# Patient Record
Sex: Male | Born: 1975 | Race: Black or African American | Hispanic: No | Marital: Married | Smoking: Never smoker
Health system: Southern US, Community
[De-identification: ages and names within clinical notes are randomized; demographics above are authoritative.]

## PROBLEM LIST (undated history)

## (undated) ENCOUNTER — Emergency Department (HOSPITAL_COMMUNITY): Admission: EM | Payer: Self-pay | Source: Home / Self Care

## (undated) DIAGNOSIS — R000 Tachycardia, unspecified: Secondary | ICD-10-CM

## (undated) DIAGNOSIS — I4901 Ventricular fibrillation: Secondary | ICD-10-CM

## (undated) HISTORY — PX: OTHER SURGICAL HISTORY: SHX169

## (undated) HISTORY — PX: DOPPLER ECHOCARDIOGRAPHY: SHX263

## (undated) HISTORY — PX: CARDIAC CATHETERIZATION: SHX172

## (undated) HISTORY — PX: CARDIAC DEFIBRILLATOR PLACEMENT: SHX171

## (undated) HISTORY — DX: Ventricular fibrillation: I49.01

## (undated) HISTORY — DX: Tachycardia, unspecified: R00.0

---

## 2004-07-07 ENCOUNTER — Emergency Department (HOSPITAL_COMMUNITY): Admission: EM | Admit: 2004-07-07 | Discharge: 2004-07-07 | Payer: Self-pay | Admitting: Emergency Medicine

## 2007-09-06 ENCOUNTER — Emergency Department (HOSPITAL_COMMUNITY): Admission: EM | Admit: 2007-09-06 | Discharge: 2007-09-07 | Payer: Self-pay | Admitting: Emergency Medicine

## 2011-01-13 ENCOUNTER — Emergency Department (HOSPITAL_COMMUNITY): Payer: BC Managed Care – PPO

## 2011-01-13 ENCOUNTER — Inpatient Hospital Stay (HOSPITAL_COMMUNITY)
Admission: EM | Admit: 2011-01-13 | Discharge: 2011-01-18 | DRG: 549 | Disposition: A | Discharge status: Home / Self Care | Payer: BC Managed Care – PPO | Attending: Internal Medicine | Admitting: Internal Medicine

## 2011-01-13 DIAGNOSIS — I472 Ventricular tachycardia, unspecified: Secondary | ICD-10-CM

## 2011-01-13 DIAGNOSIS — I4901 Ventricular fibrillation: Principal | ICD-10-CM | POA: Diagnosis present

## 2011-01-13 DIAGNOSIS — R1115 Cyclical vomiting syndrome unrelated to migraine: Secondary | ICD-10-CM | POA: Diagnosis present

## 2011-01-13 DIAGNOSIS — I4729 Other ventricular tachycardia: Secondary | ICD-10-CM | POA: Diagnosis present

## 2011-01-13 DIAGNOSIS — R569 Unspecified convulsions: Secondary | ICD-10-CM | POA: Diagnosis present

## 2011-01-13 DIAGNOSIS — I469 Cardiac arrest, cause unspecified: Secondary | ICD-10-CM | POA: Diagnosis present

## 2011-01-13 DIAGNOSIS — J9600 Acute respiratory failure, unspecified whether with hypoxia or hypercapnia: Secondary | ICD-10-CM | POA: Diagnosis present

## 2011-01-13 LAB — CBC
MCH: 30.9 pg (ref 26.0–34.0)
MCHC: 34.4 g/dL (ref 30.0–36.0)
MCV: 89.9 fL (ref 78.0–100.0)
Platelets: 236 10*3/uL (ref 150–400)
RDW: 13 % (ref 11.5–15.5)
WBC: 11.6 10*3/uL — ABNORMAL HIGH (ref 4.0–10.5)

## 2011-01-13 LAB — BASIC METABOLIC PANEL
BUN: 12 mg/dL (ref 6–23)
Calcium: 9.1 mg/dL (ref 8.4–10.5)
Creatinine, Ser: 1.41 mg/dL — ABNORMAL HIGH (ref 0.50–1.35)
GFR calc Af Amer: >60 mL/min (ref >60)
GFR calc non Af Amer: 58 mL/min — ABNORMAL LOW (ref >60)

## 2011-01-13 LAB — CK TOTAL AND CKMB (NOT AT ARMC): Relative Index: 1.2 (ref 0.0–2.5)

## 2011-01-13 LAB — RAPID URINE DRUG SCREEN, HOSP PERFORMED
Amphetamines: NOT DETECTED
Benzodiazepines: POSITIVE — AB
Cocaine: NOT DETECTED
Opiates: NOT DETECTED

## 2011-01-13 LAB — GLUCOSE, CAPILLARY: Glucose-Capillary: 144 mg/dL — ABNORMAL HIGH (ref 70–99)

## 2011-01-13 LAB — POCT I-STAT 3, ART BLOOD GAS (G3+)
Acid-base deficit: 4 mmol/L — ABNORMAL HIGH (ref 0.0–2.0)
Bicarbonate: 23.3 mEq/L (ref 20.0–24.0)
pO2, Arterial: 458 mmHg — ABNORMAL HIGH (ref 80.0–100.0)

## 2011-01-13 LAB — MRSA PCR SCREENING: MRSA by PCR: NEGATIVE

## 2011-01-13 LAB — TROPONIN I: Troponin I: <0.3 ng/mL (ref <0.30)

## 2011-01-14 ENCOUNTER — Inpatient Hospital Stay (HOSPITAL_COMMUNITY): Payer: BC Managed Care – PPO

## 2011-01-14 DIAGNOSIS — Z9911 Dependence on respirator [ventilator] status: Secondary | ICD-10-CM

## 2011-01-14 DIAGNOSIS — I469 Cardiac arrest, cause unspecified: Secondary | ICD-10-CM

## 2011-01-14 DIAGNOSIS — I472 Ventricular tachycardia: Secondary | ICD-10-CM

## 2011-01-14 DIAGNOSIS — I4901 Ventricular fibrillation: Secondary | ICD-10-CM

## 2011-01-14 DIAGNOSIS — R55 Syncope and collapse: Secondary | ICD-10-CM

## 2011-01-14 DIAGNOSIS — I517 Cardiomegaly: Secondary | ICD-10-CM

## 2011-01-14 DIAGNOSIS — I999 Unspecified disorder of circulatory system: Secondary | ICD-10-CM

## 2011-01-14 DIAGNOSIS — J9600 Acute respiratory failure, unspecified whether with hypoxia or hypercapnia: Secondary | ICD-10-CM

## 2011-01-14 LAB — BLOOD GAS, ARTERIAL
Acid-base deficit: 0.6 mmol/L (ref 0.0–2.0)
Bicarbonate: 22.4 mEq/L (ref 20.0–24.0)
FIO2: 0.4 %
O2 Saturation: 99.8 %
PEEP: 5 cmH2O
pCO2 arterial: 29.4 mmHg — ABNORMAL LOW (ref 35.0–45.0)
pO2, Arterial: 201 mmHg — ABNORMAL HIGH (ref 80.0–100.0)

## 2011-01-14 LAB — COMPREHENSIVE METABOLIC PANEL
ALT: 29 U/L (ref 0–53)
AST: 33 U/L (ref 0–37)
Albumin: 3.3 g/dL — ABNORMAL LOW (ref 3.5–5.2)
Alkaline Phosphatase: 55 U/L (ref 39–117)
Calcium: 8.9 mg/dL (ref 8.4–10.5)
GFR calc Af Amer: >60 mL/min (ref >60)
Glucose, Bld: 124 mg/dL — ABNORMAL HIGH (ref 70–99)
Potassium: 3.9 mEq/L (ref 3.5–5.1)
Sodium: 138 mEq/L (ref 135–145)
Total Protein: 6.7 g/dL (ref 6.0–8.3)

## 2011-01-14 LAB — MAGNESIUM: Magnesium: 2.5 mg/dL (ref 1.5–2.5)

## 2011-01-14 LAB — TSH: TSH: 0.411 u[IU]/mL (ref 0.350–4.500)

## 2011-01-14 LAB — BASIC METABOLIC PANEL
CO2: 26 mEq/L (ref 19–32)
Chloride: 105 mEq/L (ref 96–112)
Creatinine, Ser: 1.01 mg/dL (ref 0.50–1.35)
Glucose, Bld: 150 mg/dL — ABNORMAL HIGH (ref 70–99)

## 2011-01-14 LAB — CARDIAC PANEL(CRET KIN+CKTOT+MB+TROPI)
CK, MB: 3.4 ng/mL (ref 0.3–4.0)
Relative Index: 0.6 (ref 0.0–2.5)
Total CK: 608 U/L — ABNORMAL HIGH (ref 7–232)

## 2011-01-14 LAB — CBC
HCT: 40.8 % (ref 39.0–52.0)
Hemoglobin: 14.2 g/dL (ref 13.0–17.0)
MCHC: 34.8 g/dL (ref 30.0–36.0)
RBC: 4.58 MIL/uL (ref 4.22–5.81)

## 2011-01-14 LAB — GLUCOSE, CAPILLARY: Glucose-Capillary: 115 mg/dL — ABNORMAL HIGH (ref 70–99)

## 2011-01-14 LAB — POCT ACTIVATED CLOTTING TIME: Activated Clotting Time: 100 seconds

## 2011-01-15 ENCOUNTER — Inpatient Hospital Stay (HOSPITAL_COMMUNITY): Payer: BC Managed Care – PPO

## 2011-01-15 LAB — PHOSPHORUS: Phosphorus: 2.4 mg/dL (ref 2.3–4.6)

## 2011-01-15 LAB — GLUCOSE, CAPILLARY: Glucose-Capillary: 85 mg/dL (ref 70–99)

## 2011-01-15 LAB — BASIC METABOLIC PANEL
CO2: 27 mEq/L (ref 19–32)
Chloride: 107 mEq/L (ref 96–112)
Creatinine, Ser: 1.04 mg/dL (ref 0.50–1.35)
Sodium: 141 mEq/L (ref 135–145)

## 2011-01-15 LAB — CBC
Hemoglobin: 13.5 g/dL (ref 13.0–17.0)
MCV: 90.5 fL (ref 78.0–100.0)
Platelets: 191 10*3/uL (ref 150–400)
RBC: 4.44 MIL/uL (ref 4.22–5.81)
WBC: 11.1 10*3/uL — ABNORMAL HIGH (ref 4.0–10.5)

## 2011-01-15 LAB — MAGNESIUM: Magnesium: 2.2 mg/dL (ref 1.5–2.5)

## 2011-01-16 LAB — GLUCOSE, CAPILLARY: Glucose-Capillary: 101 mg/dL — ABNORMAL HIGH (ref 70–99)

## 2011-01-17 ENCOUNTER — Inpatient Hospital Stay (HOSPITAL_COMMUNITY): Payer: BC Managed Care – PPO

## 2011-01-17 LAB — PROTIME-INR: Prothrombin Time: 14.6 seconds (ref 11.6–15.2)

## 2011-01-17 LAB — BASIC METABOLIC PANEL
BUN: 10 mg/dL (ref 6–23)
Creatinine, Ser: 1.03 mg/dL (ref 0.50–1.35)
GFR calc Af Amer: >60 mL/min (ref >60)
GFR calc non Af Amer: >60 mL/min (ref >60)

## 2011-01-17 LAB — CBC
MCHC: 34 g/dL (ref 30.0–36.0)
RDW: 12.7 % (ref 11.5–15.5)

## 2011-01-17 LAB — APTT: aPTT: 32 seconds (ref 24–37)

## 2011-01-17 MED ORDER — GADOBENATE DIMEGLUMINE 529 MG/ML IV SOLN
20.0000 mL | Freq: Once | INTRAVENOUS | Status: AC
  Administered 2011-01-17: 20 mL via INTRAVENOUS

## 2011-01-17 NOTE — Procedures (Unsigned)
REFERRING PHYSICIAN:  Dr. Craige Cotta  HISTORY:  A 35 year old man admitted with altered mental status following witnessed seizure.  He was in the ventricular  tachycardia and shocked once.  He is lethargic and is on ventilation.  MEDICATIONS:  He is on Versed, Diprivan, Ativan, fentanyl, Norcuron.  EEG DURATION:  21.5 minutes of EEG recording.  EEG DESCRIPTION:  This is a routine 16 channel adult EEG recording with one channel devoted to limited EKG recording.  Activation procedure is performed during the photic stimulation and the study is  performed in awake and sleep state.  As the EEG opens up, I noticed that the dominant rhythm consist of 9 Hz frequency with a well-formed anterior-posterior gradient and an amplitude ranging from 14 microvolts to 27 microvolts.  The rhythm is symmetrical.  Majority of the study is reflective of the sleep state with no well-formed vertex waves, synchronous spindles and K complexes throughout the study.  No driving was noted with the photic stimulation in posterior leads.  There is no evidence to suggest electrographic seizure or epileptiform discharges based on this study.  EEG INTERPRETATION:  This is a normal sleep and awake EEG.  There is no evidence to suggest electrographic seizure or epileptiform discharges. This EEG is mostly reflective of sleep.          ______________________________ Levie Heritage, MD    ZO:XWRU D:  01/14/2011 17:48:35  T:  01/15/2011 01:42:06  Job #:  [id number]

## 2011-01-18 ENCOUNTER — Inpatient Hospital Stay (HOSPITAL_COMMUNITY): Payer: BC Managed Care – PPO

## 2011-01-18 DIAGNOSIS — I472 Ventricular tachycardia: Secondary | ICD-10-CM

## 2011-01-19 ENCOUNTER — Telehealth: Payer: Self-pay | Admitting: *Deleted

## 2011-01-19 NOTE — Telephone Encounter (Signed)
I called the patient and his wife back regarding Tylenol and made them aware this is ok to use. Per the patient, it is helping him. The patient also complains of some blurriness to his vision just after waking up, but this is new for him. This does resolve quickly after getting up. I explained this is probably not related to the device, but to let us know should this get any worse. The patient's wife verbalizes understanding.

## 2011-01-19 NOTE — Telephone Encounter (Signed)
[name], [name] S. - Pain medication More Detail >>      Pain medication      [name] S. [name]        Sent: Wed January 19, 2011  8:15 AM    To: Jefferey Pica, RN     Flags: Call patient       KEITA [place]    MRN: [id number] DOB: May 24, 1976     Pt Work: [phone] Pt Home: [phone]           Message     Pt has been taking tylenol for pain from his procedure.               (ICD Insertion).  Is this ok to take or is there another med he should be taking instead.

## 2011-01-24 NOTE — Cardiovascular Report (Signed)
  [name], [name]                 ACCOUNT NO.:  [id number]  MEDICAL RECORD NO.:  [id number]  LOCATION:  2913                         FACILITY:  [facility]  PHYSICIAN:  Peter M. Swaziland, M.D.  DATE OF BIRTH:  1975/11/14  DATE OF PROCEDURE:  01/14/2011 DATE OF DISCHARGE:                           CARDIAC CATHETERIZATION   INDICATIONS FOR PROCEDURE:  A 35 year old black male who presented with a cardiac arrest and ventricular tachycardia.  PROCEDURE:  Left heart catheterization, coronary and left ventricular angiography accesses via the right femoral artery using the standard Seldinger technique.  EQUIPMENT:  A 5-French 4-cm right and left Judkins catheter, 5-French pigtail catheter, 5-French arterial sheath  MEDICATIONS:  Local anesthesia 1% Xylocaine, Versed 2 mg IV, fentanyl 50 mcg IV, contrast 80 mL of Omnipaque.  HEMODYNAMIC DATA:  Aortic pressure was 110/71 with mean of 89 mmHg. Left ventricular pressure was 114 with EDP of 22 mmHg.  There is no significant aortic valve gradient.  ANGIOGRAPHIC DATA:  The left coronary artery arises and distributes in a dominant fashion.  The left main coronary artery is normal.  The left anterior descending artery is normal.  It gives rise to a single large diagonal branch which is also normal.  The left circumflex coronary is a dominant vessel.  It gives rise to two marginal branches and then terminates in a posterolateral branch in the PDA.  It is normal throughout.  The right coronary is a nondominant vessel and is normal.  Left ventricular angiography was performed in the RAO view.  It demonstrates normal left ventricular size and contractility with normal systolic function.  Ejection fraction is estimated at 60%.  There is no significant mitral insufficiency or prolapse.  FINAL INTERPRETATION: 1. Left dominant coronary circulation. 2. Normal coronary anatomy. 3. Normal left ventricular function.     ______________________________ Peter M. Swaziland, M.D.     PMJ/MEDQ  D:  01/14/2011  T:  01/14/2011  Job:  [id number]  cc:   Duke Salvia, MD, [facility] Coralyn Helling, MD  Electronically Signed by PETER Swaziland M.D. on 01/24/2011 09:56:04 AM

## 2011-01-31 NOTE — Op Note (Signed)
NAMEMAYER, VONDRAK                 ACCOUNT NO.:  [id number]  MEDICAL RECORD NO.:  [id number]  LOCATION:  2915                         FACILITY:  [facility]  PHYSICIAN:  Duke Salvia, MD, FACCDATE OF BIRTH:  02-01-1976  DATE OF PROCEDURE:  01/17/2011 DATE OF DISCHARGE:                              OPERATIVE REPORT   PREOPERATIVE DIAGNOSIS:  Aborted cardiac arrest.  POSTOPERATIVE DIAGNOSIS:  Aborted cardiac arrest.  PROCEDURE:  Monitored epinephrine infusion, ICD implantation with intraoperative defibrillation threshold testing.  Following obtaining informed consent, the patient was brought to the electrophysiology laboratory and placed on the fluoroscopic table in supine position.  Initially, an epinephrine infusion was undertaken at greater doses of 0.05, 0.1, and 0.2 mcg/kg per minute.  QTC intervals were measured at baseline, and following the 5-minute infusion of the aforementioned drug levels, the QTC baseline was 434; at 0.05 mcg, itwas 460; at 0.1 mcg, it was 478; and at 0.2 mcg, it was 452.  There was interesting variability in heart rate.  The patient did not meet the diagnostic criteria for QT prolongation of greater than 50 milliseconds in the QTC interval.  At this point, the patient was prepared for ICD implantation.  After routine prep and drape of the left upper chest, lidocaine was infiltrated in the prepectoral subclavicular region.  An incision was made and carried down to layer of the prepectoral fascia using electrocautery and sharp dissection, a pocket was formed similarly. Hemostasis was obtained.  Thereafter, attention was turned to gain access to the extrathoracic left subclavian vein which was accomplished with mild difficulty because of rather cephalad course, but without puncture of the artery or aspiration of air.  A single venipuncture was accomplished and a 9.5 French sheath was placed, and through this was passed a [employer] lead, model [id number].  Under fluoroscopic guidance, it was manipulated at the right ventricular apex where bipolar R-wave was 9.2 with a pace impedance of 630, threshold 1.2 to 0.5, current threshold is 1.9 MA, and there was no diaphragmatic pacing at 10 volts, the current injury brisk.  The lead was secured to the prepectoral fascia and then attached to a [employer] single chamber ICD, model CD 1257, serial number [id number].  Through the device, bipolar R-wave was 12 with a pace impedance of 580 and threshold 0.5 at 0.5.  At this point, defibrillation threshold testing was undertaken.  Ventricular fibrillation was induced via the T-wave shock after a total duration of 5.5 seconds, 14 joules shock was delivered through measured resistance of 65 ohms terminating ventricular fibrillation and restoring sinus rhythm.  The device was implanted.  Hemostasis was assured.  The lead and the pulse generator were placed in the pocket secured to the prepectoral fascia.  The wound was then closed in three layers in the normal fashion.  The wound was washed, dried, and a benzoin Steri-Strip dressing was applied.  Needle counts, sponge counts, and instrument counts were correct at end of the procedure according to staff.  The patient tolerated the procedure without apparent complication.     Duke Salvia, MD, [facility]     SCK/MEDQ  D:  01/17/2011  T:  01/18/2011  Job:  [id number]  Electronically Signed by Sherryl Manges MD [facility] on 01/31/2011 01:52:40 PM

## 2011-01-31 NOTE — Discharge Summary (Signed)
NAMEOLUSEGUN, GERSTENBERGER                 ACCOUNT NO.:  [id number]  MEDICAL RECORD NO.:  [id number]  LOCATION:  2915                         FACILITY:  [facility]  PHYSICIAN:  Duke Salvia, MD, FACCDATE OF BIRTH:  10/03/1975  DATE OF ADMISSION:  01/13/2011 DATE OF DISCHARGE:  01/18/2011                              DISCHARGE SUMMARY   The patient does not have a primary care physician.  ELECTROPHYSIOLOGIST:  New, Dr. Sherryl Manges  PRIMARY DIAGNOSIS:  Aborted sudden cardiac arrest.  ALLERGIES:  The patient has no known drug allergies.  PROCEDURES THIS ADMISSION: 1. CT of the head on January 13, 2011, demonstrated a normal head CT. 2. Echocardiogram on January 14, 2011, demonstrated an ejection     fraction of 60-65% with no wall motion abnormalities. 3. Cardiac catheterization on January 14, 2011, demonstrated a left     dominant coronary circulation with normal coronary anatomy and     normal left ventricular function. 4. EEG on January 14, 2011, demonstrated a normal sleep and awake EEG. 5. Epinephrine challenge, borderline diagnostic for long QT syndrome. 6. Flecainide challenge with Brugada negative. 7. Cardiac MRI on January 17, 2011.  This study has not been read yet. 8. Implantation of a single-chamber defibrillator on January 17, 2011,     by Dr. Graciela Husbands.  The patient received a [employer], model     number 1257-40 ICD with model number 0181 right ventricular lead.     DFTs at the time of implant were less than or equal to 14 joules.     The patient had no early apparent complications. 9. Chest x-ray on January 18, 2011, reviewed by Dr. Graciela Husbands, demonstrated     stable lead position and no pneumothorax status post device     implant.  BRIEF HISTORY OF PRESENT ILLNESS:  Mr. Kille is a 35 year old male with no significant past medical history.  On the day of admission, he was at work, had a syncopal episode and subsequent seizure-like activity.  He was given CPR and 911 was called.   When EMS arrived, he was found to be in ventricular fibrillation and was shocked back to sinus rhythm.  He was then transported to [facility] for further evaluation.  HOSPITAL COURSE:  The patient was admitted on January 13, 2011, for aborted cardiac arrest.  He was evaluated by Dr. Ladona Ridgel with Electrophysiology.  He underwent extensive testing including echocardiogram, catheterization, cardiac MRI, epinephrine infusion to determine the etiology of his arrest.  These to date have been nondiagnostic.  He underwent ICD implantation on January 17, 2011, by Dr. Graciela Husbands.  Details are as outlined above.  He had no ventricular ectopy while in hospital.  Dr. Graciela Husbands examined the patient on January 18, 2011, and considered him stable for discharge.  The patient will have outpatient genetic testing completed at his wound check visit.  He has been advised no driving for 6 months secondary to cardiac arrest.  FOLLOWUP APPOINTMENTS: 1. [facility] on February 02, 2011, at 10:00 a.m.     Also at this time, the patient will undergo genetic testing. 2. Dr. Graciela Husbands with a treadmill to rule out CPVT on  March 07, 2011, at     10:45 a.m.  DISCHARGE INSTRUCTIONS: 1. Increase activity slowly. 2. No driving for 6 months. 3. See supplemental discharge instructions for wound care and arm     mobility. 4. Keep incision clean and dry for 1 week.  DISCHARGE MEDICATIONS:  Metoprolol 25 mg 1 tablet twice daily - this is a new prescription for the patient.  LABORATORY DATA THIS ADMISSION:  Potassium 3.9, glucose 102, BUN 10, creatinine 1.03, INR 1.12.  Hemoglobin 14.8, hematocrit 43.5, platelets 236.  Phosphorus 2.4, magnesium 2.2.  TSH 0.411.  Cardiac enzymes negative.  DISPOSITION:  The patient was seen and examined by Dr. Graciela Husbands on January 18, 2011, and considered stable for discharge.  DURATION OF DISCHARGE ENCOUNTER:  Thirty-five minutes.     Gypsy Balsam,  RN,BSN   ______________________________ Duke Salvia, MD, [facility]    AS/MEDQ  D:  01/18/2011  T:  01/18/2011  Job:  [id number]  Electronically Signed by Gypsy Balsam RNBSN on 01/21/2011 01:29:52 PM Electronically Signed by Sherryl Manges MD [facility] on 01/31/2011 01:52:37 PM

## 2011-02-02 ENCOUNTER — Encounter: Payer: Self-pay | Admitting: Internal Medicine

## 2011-02-02 ENCOUNTER — Ambulatory Visit (INDEPENDENT_AMBULATORY_CARE_PROVIDER_SITE_OTHER): Payer: BC Managed Care – PPO | Admitting: *Deleted

## 2011-02-02 DIAGNOSIS — I469 Cardiac arrest, cause unspecified: Secondary | ICD-10-CM

## 2011-02-02 LAB — ICD DEVICE OBSERVATION
BRDY-0002RV: 40 {beats}/min
CHARGE TIME: 8.5 s
FVT: 0
HV IMPEDENCE: 65 Ohm
PACEART VT: 0
RV LEAD AMPLITUDE: 11.7 mv
RV LEAD IMPEDENCE ICD: 437.5 Ohm
RV LEAD THRESHOLD: 0.75 V
VF: 0

## 2011-02-02 NOTE — Progress Notes (Signed)
icd check in clinic  

## 2011-02-04 ENCOUNTER — Ambulatory Visit (INDEPENDENT_AMBULATORY_CARE_PROVIDER_SITE_OTHER): Payer: BC Managed Care – PPO | Admitting: *Deleted

## 2011-02-04 DIAGNOSIS — I469 Cardiac arrest, cause unspecified: Secondary | ICD-10-CM

## 2011-02-04 DIAGNOSIS — R0989 Other specified symptoms and signs involving the circulatory and respiratory systems: Secondary | ICD-10-CM

## 2011-02-04 MED ORDER — CEPHALEXIN 500 MG PO CAPS: 500.0000 mg | ORAL_CAPSULE | Freq: Three times a day (TID) | ORAL | Status: AC

## 2011-02-04 NOTE — Patient Instructions (Signed)
Your physician has recommended you make the following change in your medication:  1) Start Keflex 500mg  every 8 hours for 7 days.

## 2011-02-11 ENCOUNTER — Other Ambulatory Visit: Payer: Self-pay | Admitting: Internal Medicine

## 2011-02-11 ENCOUNTER — Ambulatory Visit (INDEPENDENT_AMBULATORY_CARE_PROVIDER_SITE_OTHER): Payer: BC Managed Care – PPO | Admitting: *Deleted

## 2011-02-11 DIAGNOSIS — I469 Cardiac arrest, cause unspecified: Secondary | ICD-10-CM

## 2011-02-11 DIAGNOSIS — R0989 Other specified symptoms and signs involving the circulatory and respiratory systems: Secondary | ICD-10-CM

## 2011-02-11 LAB — ICD DEVICE OBSERVATION: DEVICE MODEL ICD: 1017632

## 2011-02-12 ENCOUNTER — Encounter: Payer: Self-pay | Admitting: Internal Medicine

## 2011-02-27 NOTE — Consult Note (Signed)
NAME:  [name], [name]                 ACCOUNT NO.:  [id number]  MEDICAL RECORD NO.:  [id number]  LOCATION:  [facility]                         FACILITY:  [facility]  PHYSICIAN:  Henderson Cloud, MD     DATE OF BIRTH:  1976-03-24  DATE OF CONSULTATION: DATE OF DISCHARGE:                                CONSULTATION   CONSULTING SERVICE:  [facility].  CHIEF COMPLAINT:  Ventricular tachycardia per EMS.  HISTORY OF PRESENT ILLNESS:  The patient is a 35 year old black male with no past medical history who is presenting with seizure-like activity and ventricular tachycardia, requiring defibrillation.  The patient is currently intubated and sedated, so some of the history is taken from the patient's wife and mother.  They state that he has no past medical history and that he has been in his normal state of health recently without any complaints.  He has had no recent exposures, but did have a long car trip approximately 1 week ago.  While at work today, the patient had a syncopal episode and subsequent seizure-like activity. When he was given CPR and when EMS arrived, he was found to be in ventricular tachycardia.  He was defibrillated back in to normal sinus rhythm and intubated for airway protection.  He is currently indicated and sedated.  The patient's family is unaware of any complaints the patient has had prior to this episode.  PAST MEDICAL HISTORY:  None.  SOCIAL HISTORY:  No tobacco, no alcohol.  No drug use.  FAMILY HISTORY:  Positive for irregular heart rhythms, it does not appear to be premature coronary artery disease.  ALLERGIES:  No known drug allergies.  MEDICATIONS:  None except for occasional vitamins.  REVIEW OF SYSTEMS:  Unable to be obtained secondary to the patient's being intubated and sedated.  PHYSICAL EXAMINATION:  VITAL SIGNS:  His blood pressure is 128/83, heart rate is 87, respiratory rate is 16. GENERAL:  In no acute distress as he is intubated and  sedated. HEENT:  Normocephalic, atraumatic. NECK:  Supple.  There is no JVD. HEART:  Regular rate and rhythm without murmur, rub, or gallop. LUNGS:  Clear anteriorly. ABDOMEN:  Soft. EXTREMITIES:  Without edema. SKIN:  Warm and dry. PSYCHIATRIC, NEUROLOGIC, MUSCULOSKELETAL:  The patient is not able to cooperate secondary to intubation and sedation.  LABS:  Sodium is 135, potassium 3.8, chloride 98, CO2 20, BUN 12, creatinine 1.4, glucose 171, calcium 9.1.  White count is 11.6, hemoglobin is 14.7, hematocrit 42.7, platelet count 236.  Total CK is 256, troponin is less than 0.30.  Alcohol level is less than 11.  Urine drug screen is negative except for benzos which he received during his intubation.  Magnesium level is 2.0.  Noncontrast CT of the head shows no acute abnormalities.  Chest x-ray shows no acute cardiopulmonary process.  EKG independently interpreted by myself demonstrates sinus tachycardia with occasional premature ventricular contractions.  There is a nonspecific interventricular conduction delay and a mildly prolonged QTc at approximately 500 milliseconds.  ASSESSMENT:  Per EMS report, the patient had a ventricular tachycardia arrest.  Unfortunately, there are no rhythm strips here to confirm that. The patient did receive CPR  early and once he was defibrillated, he was arousable and responsive.  The only thing even mildly peculiar in his recent history is a long car trip last week.  PLAN:  The patient should have an echocardiogram to evaluate his left ventricular systolic function.  Once stable, he should also likely undergo left heart catheterization to define his coronary anatomy and rule out premature coronary disease as an etiology for the arrhythmia which is probable ventricular tachycardia.  If the echo and left heart catheterization are unrevealing, then EP study would likely be indicated.  For now, I would check an echo and TSH.  It also may be worthwhile to  check bilateral lower extremity Dopplers to rule out deep vein thrombosis in light of his recent car trip.     Henderson Cloud, MD     SGA/MEDQ  D:  01/13/2011  T:  01/13/2011  Job:  [id number]  Electronically Signed by Raynelle Bring MD on 02/27/2011 09:57:47 AM

## 2011-03-01 LAB — BASIC METABOLIC PANEL
CO2: 29
Chloride: 102
Creatinine, Ser: 1.09
GFR calc Af Amer: >60
Potassium: 3.8
Sodium: 139

## 2011-03-01 LAB — POCT CARDIAC MARKERS
Myoglobin, poc: 48.6
Operator id: 4533
Troponin i, poc: <0.05

## 2011-03-01 LAB — DIFFERENTIAL
Eosinophils Absolute: 0.1
Lymphs Abs: 1.4
Monocytes Absolute: 0.5
Monocytes Relative: 9
Neutrophils Relative %: 65

## 2011-03-01 LAB — CBC
Hemoglobin: 14.8
MCV: 90.9
RBC: 4.55
WBC: 5.8

## 2011-03-04 ENCOUNTER — Encounter: Payer: Self-pay | Admitting: *Deleted

## 2011-03-07 ENCOUNTER — Ambulatory Visit (INDEPENDENT_AMBULATORY_CARE_PROVIDER_SITE_OTHER): Payer: BC Managed Care – PPO | Admitting: Internal Medicine

## 2011-03-07 DIAGNOSIS — I469 Cardiac arrest, cause unspecified: Secondary | ICD-10-CM

## 2011-03-07 NOTE — Progress Notes (Signed)
Exercise Treadmill Test  Pre-Exercise Testing Evaluation Rhythm: normal sinus  Rate: 84   PR:  .15 QRS:  .10  QT:  .38 QTc: .45     Test  Exercise Tolerance Test Ordering MD: Sherryl Manges, MD  Interpreting MD:  Sherryl Manges, MD  Unique Test No: 1  Treadmill:  1  Indication for ETT: cardiac arrest  Contraindication to ETT: No   Stress Modality: exercise - treadmill  Cardiac Imaging Performed: non   Protocol: standard Bruce - maximal  Max BP:  168/72  Max MPHR (bpm):  185 85% MPR (bpm):  157  MPHR obtained (bpm): 160 % MPHR obtained:  86  Reached 85% MPHR (min:sec):  6:00 Total Exercise Time (min-sec):  6:24  Workload in METS:  17.2 Borg Scale:   Reason ETT Terminated:  patient's desire to stop    ST Segment Analysis At Rest: normal ST segments - no evidence of significant ST depression With Exercise: no evidence of significant ST depression  Other Information Arrhythmia:  No Angina during ETT:  absent (0) Quality of ETT:  diagnostic  ETT Interpretation:  normal - no evidence of ischemia by ST analysis  Comments: No exerciseinduced arrhtyhmia and upper HR 160 on metoprolol this am  Recommendations: Continue current therapy

## 2011-03-17 NOTE — Consult Note (Signed)
[name], [name]                 ACCOUNT NO.:  [id number]  MEDICAL RECORD NO.:  [id number]  LOCATION:  2913                         FACILITY:  [facility]  PHYSICIAN:  Doylene Canning. Ladona Ridgel, MD    DATE OF BIRTH:  [birth year]  DATE OF CONSULTATION:  01/14/2011 DATE OF DISCHARGE:                                CONSULTATION   REQUESTING PHYSICIAN:  Henderson Cloud, MD  INDICATION FOR CONSULTATION:  Evaluation of ventricular fibrillation arrest.  HISTORY OF PRESENT ILLNESS:  The patient is a 35 year old man who has been under normal health.  He presented to the emergency room after being found down with seizure-like activity.  He was pulseless.  Review of the patient's EMS records demonstrates that he was in ventricular fibrillation and was successfully defibrillated.  He had some seizure- like activity.  He had been intubated in the field.  By the time he arrived in the emergency room, he was awake and moving all of extremities but somewhat agitated.  He is admitted now for additional evaluation and referral for EP evaluation.  The patient remains extubated but is awake and able to answer questions.  He denies a history of sudden cardiac death and denies a family history of sudden cardiac death or unexplained death.  There is no family history of long QT syndrome or Brugada syndrome or other cardiomyopathies.  The patient denied anginal symptoms or shortness of breath prior to the event.  He has never had an episode like this before.  PAST MEDICAL HISTORY:  Basically unremarkable.  SOCIAL HISTORY:  The patient denies tobacco or ethanol abuse.  FAMILY HISTORY:  Negative for premature coronary disease or sudden cardiac death.  REVIEW OF SYSTEMS:  The patient is intubated but his review of systems suggests that he has otherwise been well.  SOCIAL HISTORY:  The patient works for the [employer] as an Engineer, site.  PHYSICAL EXAMINATION:  GENERAL:  He is intubated but  awake.  He is well appearing in no acute distress. VITAL SIGNS:  Blood pressure was 117/80, the pulse was 78 and regular, respirations were 18. HEENT:  Normocephalic and atraumatic.  Pupils equal and round. Oropharynx moist.  Sclerae anicteric. NECK:  Revealed no jugular venous distention.  There is no thyromegaly. Trachea is midline.  Carotids are 2+ and symmetric. LUNGS:  Clear bilaterally to auscultation.  No wheezes, rales, or rhonchi are present.  There is no increased work of breathing. CARDIOVASCULAR:  Regular rate and rhythm.  Normal S1 and S2. ABDOMEN:  Soft, nontender, nondistended.  There is no organomegaly. Bowel sounds present.  No rebound or guarding. EXTREMITIES:  Demonstrate no cyanosis, clubbing, or edema.  The pulses are 2+ and symmetric. NEUROLOGIC:  The patient is alert, awake, answers all questions and moves all extremities well.  EKG demonstrates sinus rhythm with incomplete right bundle-branch block. There was a coved ST-segment elevation which is very mild in lead V1, questionable Brugada pattern.  IMPRESSION: 1. Ventricular fibrillation arrest. 2. Questionable aspiration in the field. 3. Status post successful resuscitation with mechanical ventilation     ongoing.  DISCUSSION AND PLAN:  The etiology of the patient's symptoms is unclear.  We will first plan to proceed with extubation and follow this with left heart catheterization.  There is no evidence that the patient has hypertrophic cardiomyopathy by EKG or exam but a 2-D echo will be recommended as well.  If the patient has any evidence of right ventricular dysfunction, then cardiac MRI scanning would be warranted. Obviously if he has coronary disease, treatment of anginal symptoms will be warranted.  His EKG does not suggest long QT syndrome as the cause, although his corrected QT is elevated.  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  If no reversible causes demonstrated, then insertion of an automatic implantable defibrillator would be recommended.     Doylene Canning. Ladona Ridgel, MD     GWT/MEDQ  D:  01/14/2011  T:  01/14/2011  Job:  [id number]  Electronically Signed by Lewayne Bunting MD on 03/17/2011 78:29:56 PM

## 2011-04-21 ENCOUNTER — Telehealth: Payer: Self-pay | Admitting: Internal Medicine

## 2011-04-21 NOTE — Telephone Encounter (Signed)
New problem [name] from Disability rms wants to know about disability paperwork and if pt is still out of work Please call her back

## 2011-04-21 NOTE — Telephone Encounter (Signed)
LMTCB ./CY 

## 2011-04-21 NOTE — Telephone Encounter (Signed)
New problem Boneta Lucks from disability rms wants to know about disability paperwork she had sent to you. Please call her back

## 2011-04-22 NOTE — Telephone Encounter (Signed)
Kim from Medical records pulled a copy of what was previously completed for the patient. I have left a message for Boneta Lucks to call.

## 2011-04-25 NOTE — Telephone Encounter (Signed)
I attempted to call [name] back. The person I spoke with reviewed the patient's chart and stated that [name] received the information that she needed. I did not need to call back.

## 2011-05-03 ENCOUNTER — Encounter: Payer: BC Managed Care – PPO | Admitting: Internal Medicine

## 2011-05-12 ENCOUNTER — Telehealth: Payer: Self-pay | Admitting: Internal Medicine

## 2011-05-12 NOTE — Telephone Encounter (Addendum)
Pt Dropped off Report for Short Term Disability & Eligibility Review,Dr.Klein just needs to  Sign & fill in # 5, Call Pt @ [phone] when complete, sent to [facility]. 05/12/11   Dr.Klein Completed # 5 & Signed Report for Short Term Disability called Pt @ 10:10 am/ [phone] ( cell phone stated he was not Accepting Phone calls @ this Time) will call back at a Later Time 05/16/11/km  Called Mr.Catterton let him know STD Report ready for [facility] 05/16/11/ 1:15 pm/KM

## 2011-05-16 NOTE — Telephone Encounter (Signed)
Patient picked up STD paper.dc

## 2011-05-20 ENCOUNTER — Telehealth: Payer: Self-pay | Admitting: Internal Medicine

## 2011-05-20 NOTE — Telephone Encounter (Addendum)
ROI was Emailed to pt. Per his Request  05/20/11/km  Pt Brought ROI to Office, I faxed ICD Check & TM Test over to Ms.Malen Gauze @ [phone] Per Pt's Request 05/20/11/km

## 2011-05-27 ENCOUNTER — Ambulatory Visit (INDEPENDENT_AMBULATORY_CARE_PROVIDER_SITE_OTHER): Payer: BC Managed Care – PPO | Admitting: Internal Medicine

## 2011-05-27 ENCOUNTER — Encounter: Payer: Self-pay | Admitting: Internal Medicine

## 2011-05-27 ENCOUNTER — Other Ambulatory Visit: Payer: Self-pay | Admitting: Internal Medicine

## 2011-05-27 VITALS — BP 100/62 | Ht 71.0 in | Wt 193.0 lb

## 2011-05-27 DIAGNOSIS — I469 Cardiac arrest, cause unspecified: Secondary | ICD-10-CM | POA: Insufficient documentation

## 2011-05-27 DIAGNOSIS — Z95810 Presence of automatic (implantable) cardiac defibrillator: Secondary | ICD-10-CM | POA: Insufficient documentation

## 2011-05-27 LAB — ICD DEVICE OBSERVATION
BRDY-0002RV: 40 {beats}/min
DEVICE MODEL ICD: 1017632
FVT: 0
TOT-0007: 1
VENTRICULAR PACING ICD: 0 pct
VF: 0

## 2011-05-27 NOTE — Patient Instructions (Signed)
Your physician recommends that you schedule a follow-up appointment in: 3 months with devic clinic and 6 months with Dr Graciela Husbands  Your physician has recommended you make the following change in your medication:  1) Stop Metoprolol 2) Start Nadolol 20mg  daily --try for 4 weeks then 3) Start Atenolol 25mg  daily for 4 weeks 4) Call us back after finishing both and let us know which one you like best

## 2011-05-27 NOTE — Assessment & Plan Note (Signed)
No recurrent arrhythmia. As noted the patient has a Gene mutation which will be pursued further through cascade testing. I reviewed this with the family.Genetic counseling will also be forthcoming

## 2011-05-27 NOTE — Assessment & Plan Note (Signed)
The patient's device was interrogated.  The information was reviewed. No changes were made in the programming.    

## 2011-05-27 NOTE — Progress Notes (Signed)
  HPI  [name] is a 35 y.o. male Seen in followup for abortive cardiac arrest. He status post ICD implantation.  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 We have discussed with him I counseling which [facility]  Past Medical History  Diagnosis Date  . Cardiac arrest - ventricular fibrillation     aborted  . ICD (implantable cardiac defibrillator) in place     sudden cardiac arrest/St. Jude  . Cardiac arrest     Past Surgical History  Procedure Date  . Cardiac defibrillator placement     St. Jude 1257-40  . Cardiac catheterization   . Doppler echocardiography   . Cardiac mri     Current Outpatient Prescriptions  Medication Sig Dispense Refill  . metoprolol tartrate (LOPRESSOR) 25 MG tablet Take 25 mg by mouth 2 (two) times daily.          No Known Allergies  Review of Systems negative except from HPI and PMH  Physical Exam Well developed and well nourished in no acute distress HENT normal E scleral and icterus clear Neck Supple JVP flat; carotids brisk and full Clear to ausculation Regular rate and rhythm, no murmurs gallops or rub Soft with active bowel sounds No clubbing cyanosis none Edema Alert and oriented, grossly normal motor and sensory function Skin Warm and Dry   Assessment and  Plan'

## 2011-05-30 ENCOUNTER — Telehealth: Payer: Self-pay | Admitting: Internal Medicine

## 2011-05-30 NOTE — Telephone Encounter (Signed)
Spoke with Pt on Several occasions, He needed Letter stating Restrictions ( If Any),Driving Restrictions ( If Any) faxed to His Disability Company  Dr.Klein Generated Letter was asked to fax to [place]/ Disability @ [phone]  05/30/11/km

## 2011-06-14 ENCOUNTER — Telehealth: Payer: Self-pay | Admitting: Internal Medicine

## 2011-06-14 NOTE — Telephone Encounter (Addendum)
PT Dropped Off Benefits & Medical Report  For Eligibility Review Just needs Doctors Signature Graciela Husbands) does not need to be sent to [facility] for Completion.Once completed call  Pt @ [phone] for Pick Up & fax to [employer] Benefit Specialist/[employer] @ [phone] 06/14/11/KM  Dr.Klein Signed Marybelle Killings, Called Mr.Dura he will be up here to pick up Today 06/14/11/Km  Mr.Vandermeer picked up his paperwork called back and said it was wrong, it says he can resume Driving Feb 9th, and go back to work now, he called asking for his RTW date to be changed to Feb 9th 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  06/16/11/KM  Dr.Klein Generated Note for Pt,Patient was called for Pick up Today 06/20/11/Km  Mr.Desena picked up Note & Paperwork Today 06/21/11/KM

## 2011-06-17 ENCOUNTER — Encounter: Payer: Self-pay | Admitting: Internal Medicine

## 2011-07-04 ENCOUNTER — Other Ambulatory Visit: Payer: Self-pay | Admitting: Internal Medicine

## 2011-07-04 NOTE — Telephone Encounter (Signed)
Pt wants to know if ok to take ibuprofen with his nadolol, pls call [phone]

## 2011-07-05 MED ORDER — NADOLOL 20 MG PO TABS: 20.0000 mg | ORAL_TABLET | Freq: Every day | ORAL | Status: DC

## 2011-07-05 NOTE — Telephone Encounter (Signed)
FU Call: pt wife calling to check on status of nadolol refill. Please call RX in ASAP.

## 2011-07-05 NOTE — Telephone Encounter (Signed)
FU Call: Pt wife calling wanting to check on status of nadolol refill.

## 2011-07-21 ENCOUNTER — Telehealth: Payer: Self-pay | Admitting: Internal Medicine

## 2011-07-21 NOTE — Telephone Encounter (Signed)
New Problem:     Patient's wife called wondering if her husband could get a massage.  Please call back.

## 2011-07-21 NOTE — Telephone Encounter (Signed)
Spoke with pt wife, okay given for massage.

## 2011-08-22 ENCOUNTER — Ambulatory Visit (INDEPENDENT_AMBULATORY_CARE_PROVIDER_SITE_OTHER): Payer: BC Managed Care – PPO | Admitting: *Deleted

## 2011-08-22 ENCOUNTER — Encounter: Payer: Self-pay | Admitting: Internal Medicine

## 2011-08-22 DIAGNOSIS — I469 Cardiac arrest, cause unspecified: Secondary | ICD-10-CM

## 2011-08-22 DIAGNOSIS — I509 Heart failure, unspecified: Secondary | ICD-10-CM

## 2011-08-22 LAB — ICD DEVICE OBSERVATION
CHARGE TIME: 8.6 s
DEV-0020ICD: NEGATIVE
DEVICE MODEL ICD: 1017632
RV LEAD AMPLITUDE: 11.7 mv
RV LEAD IMPEDENCE ICD: 437.5 Ohm
RV LEAD THRESHOLD: 0.5 V
TOT-0007: 1
TOT-0008: 0
TOT-0009: 1

## 2011-08-22 NOTE — Progress Notes (Signed)
ICD check 

## 2011-11-24 ENCOUNTER — Encounter: Payer: BC Managed Care – PPO | Admitting: *Deleted

## 2011-12-05 ENCOUNTER — Encounter: Payer: Self-pay | Admitting: *Deleted

## 2012-02-03 ENCOUNTER — Encounter: Payer: BC Managed Care – PPO | Admitting: Internal Medicine

## 2012-02-07 ENCOUNTER — Encounter: Payer: Self-pay | Admitting: *Deleted

## 2012-02-14 ENCOUNTER — Encounter: Payer: Self-pay | Admitting: Internal Medicine

## 2012-02-14 ENCOUNTER — Ambulatory Visit (INDEPENDENT_AMBULATORY_CARE_PROVIDER_SITE_OTHER): Payer: BC Managed Care – PPO | Admitting: Internal Medicine

## 2012-02-14 VITALS — BP 116/80 | HR 64 | Ht 71.0 in | Wt 191.0 lb

## 2012-02-14 DIAGNOSIS — Z95810 Presence of automatic (implantable) cardiac defibrillator: Secondary | ICD-10-CM

## 2012-02-14 DIAGNOSIS — I469 Cardiac arrest, cause unspecified: Secondary | ICD-10-CM

## 2012-02-14 LAB — ICD DEVICE OBSERVATION
BRDY-0002RV: 40 {beats}/min
CHARGE TIME: 9.1 s
DEVICE MODEL ICD: 1017632
RV LEAD AMPLITUDE: 11.7 mv
VENTRICULAR PACING ICD: 1 pct

## 2012-02-14 NOTE — Assessment & Plan Note (Signed)
.  dfnb The patient's device was interrogated.  The information was reviewed. No changes were made in the programming.    

## 2012-02-14 NOTE — Progress Notes (Signed)
Patient has no care team.   HPI  [name] is a 36 y.o. male Seen in followup for cardiac arrest and is status post ICD implantation. 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  There have been no palpitations, lightheadedness or syncope.    Past Medical History  Diagnosis Date  . Cardiac arrest - ventricular fibrillation     aborted  . ICD (implantable cardiac defibrillator) in place     sudden cardiac arrest/St. Jude  . Cardiac arrest     Past Surgical History  Procedure Date  . Cardiac defibrillator placement     St. Jude 1257-40  . Cardiac catheterization   . Doppler echocardiography   . Cardiac mri     Current Outpatient Prescriptions  Medication Sig Dispense Refill  . nadolol (CORGARD) 20 MG tablet Take 1 tablet (20 mg total) by mouth daily.  30 tablet  11    No Known Allergies  Review of Systems negative except from HPI and PMH  Physical Exam BP 116/80  Pulse 64  Ht 5\' 11"  (1.803 m)  Wt 191 lb (86.637 kg)  BMI 26.64 kg/m2 Well developed and well nourished in no acute distress HENT normal E scleral and icterus clear Neck Supple JVP flat; carotids brisk and full Clear to ausculation Device pocket well healed; without hematoma or erythema Regular rate and rhythm, no murmurs gallops or rub Soft with active bowel sounds No clubbing cyanosis none Edema Alert and oriented, grossly normal motor and sensory function Skin Warm and Dry    Assessment and  Plan

## 2012-02-14 NOTE — Assessment & Plan Note (Signed)
stabel

## 2012-02-14 NOTE — Patient Instructions (Signed)
Remote monitoring is used to monitor your Pacemaker of ICD from home. This monitoring reduces the number of office visits required to check your device to one time per year. It allows [place] to keep an eye on the functioning of your device to ensure it is working properly. You are scheduled for a device check from home on 05/21/12. You may send your transmission at any time that day. If you have a wireless device, the transmission will be sent automatically. After your physician reviews your transmission, you will receive a postcard with your next transmission date.  Your physician wants you to follow-up in: 1 year with Dr. Graciela Husbands. You will receive a reminder letter in the mail two months in advance. If you don't receive a letter, please call our office to schedule the follow-up appointment.  Your physician recommends that you continue on your current medications as directed. Please refer to the Current Medication list given to you today.

## 2012-04-09 ENCOUNTER — Telehealth: Payer: Self-pay | Admitting: Internal Medicine

## 2012-04-09 NOTE — Telephone Encounter (Signed)
New problem:  C/o shoulder pain - on the back of his pacer maker.  H/o pacermaker

## 2012-04-10 NOTE — Telephone Encounter (Signed)
F/u   Wife called to report patient has sent remote transmission, she can be reached at hm# [phone]

## 2012-04-10 NOTE — Telephone Encounter (Signed)
Transmission was not received. Spoke w/wife in regards to this and will have pt resend transmission/kwm

## 2012-04-10 NOTE — Telephone Encounter (Signed)
Spoke w/wife in regards to shoulder pain. Pt just started on Sunday while at the [place] football game. Pt describes as sharp pain with some tingling. No chest pain or SOB associated with shoulder pain. Pain not associated with exertion. Pt to send transmission sometime today to check ICD. Pt to be called back once transmission received.

## 2012-04-11 NOTE — Telephone Encounter (Signed)
Transmission received. All was normal with transmission.

## 2012-04-11 NOTE — Telephone Encounter (Signed)
Instructed to pt's wife if pain continues to call PCP. Pain does not seem to be device related.

## 2012-04-19 ENCOUNTER — Telehealth: Payer: Self-pay | Admitting: Internal Medicine

## 2012-04-19 NOTE — Telephone Encounter (Signed)
Ok for pt to take dramamine for motion sickness on cruise.

## 2012-04-19 NOTE — Telephone Encounter (Signed)
New problem:   Wife calling  can he take morning sickness medication.   Along with he medication he current taken due to upcoming cruise.

## 2012-05-21 ENCOUNTER — Encounter: Payer: BC Managed Care – PPO | Admitting: *Deleted

## 2012-06-04 ENCOUNTER — Encounter: Payer: Self-pay | Admitting: *Deleted

## 2012-06-18 ENCOUNTER — Encounter: Payer: Self-pay | Admitting: Internal Medicine

## 2012-06-18 ENCOUNTER — Ambulatory Visit (INDEPENDENT_AMBULATORY_CARE_PROVIDER_SITE_OTHER): Payer: BC Managed Care – PPO | Admitting: *Deleted

## 2012-06-18 DIAGNOSIS — I469 Cardiac arrest, cause unspecified: Secondary | ICD-10-CM

## 2012-06-18 DIAGNOSIS — Z95810 Presence of automatic (implantable) cardiac defibrillator: Secondary | ICD-10-CM

## 2012-06-18 LAB — REMOTE ICD DEVICE
BRDY-0002RV: 40 {beats}/min
DEV-0020ICD: NEGATIVE
HV IMPEDENCE: 69 Ohm
RV LEAD IMPEDENCE ICD: 440 Ohm
VENTRICULAR PACING ICD: 1 pct

## 2012-07-03 ENCOUNTER — Telehealth: Payer: Self-pay | Admitting: Internal Medicine

## 2012-07-03 ENCOUNTER — Encounter: Payer: Self-pay | Admitting: *Deleted

## 2012-07-03 NOTE — Telephone Encounter (Signed)
New Problem ° ° ° °Refill Request °Nadolol 20 mg to [facility] °

## 2012-07-03 NOTE — Telephone Encounter (Signed)
New problem:   Nadolol  20 mg.    [facility] on [address]

## 2012-07-05 ENCOUNTER — Other Ambulatory Visit: Payer: Self-pay | Admitting: *Deleted

## 2012-07-05 MED ORDER — NADOLOL 20 MG PO TABS: 20.0000 mg | ORAL_TABLET | Freq: Every day | ORAL | Status: DC

## 2012-07-05 NOTE — Telephone Encounter (Signed)
New Problem    Refill Request Nadolol 20 mg to [facility]

## 2012-09-17 ENCOUNTER — Ambulatory Visit (INDEPENDENT_AMBULATORY_CARE_PROVIDER_SITE_OTHER): Payer: BC Managed Care – PPO | Admitting: *Deleted

## 2012-09-17 ENCOUNTER — Encounter: Payer: Self-pay | Admitting: Internal Medicine

## 2012-09-17 ENCOUNTER — Other Ambulatory Visit: Payer: Self-pay

## 2012-09-17 DIAGNOSIS — Z95810 Presence of automatic (implantable) cardiac defibrillator: Secondary | ICD-10-CM

## 2012-09-17 DIAGNOSIS — I469 Cardiac arrest, cause unspecified: Secondary | ICD-10-CM

## 2012-09-17 LAB — REMOTE ICD DEVICE
BRDY-0002RV: 40 {beats}/min
DEV-0020ICD: NEGATIVE

## 2012-10-04 ENCOUNTER — Encounter: Payer: Self-pay | Admitting: *Deleted

## 2012-12-17 ENCOUNTER — Encounter: Payer: Self-pay | Admitting: Internal Medicine

## 2012-12-17 ENCOUNTER — Ambulatory Visit (INDEPENDENT_AMBULATORY_CARE_PROVIDER_SITE_OTHER): Payer: BC Managed Care – PPO | Admitting: *Deleted

## 2012-12-17 DIAGNOSIS — I469 Cardiac arrest, cause unspecified: Secondary | ICD-10-CM

## 2012-12-17 DIAGNOSIS — Z95810 Presence of automatic (implantable) cardiac defibrillator: Secondary | ICD-10-CM

## 2012-12-17 LAB — REMOTE ICD DEVICE
BRDY-0002RV: 40 {beats}/min
DEV-0020ICD: NEGATIVE
HV IMPEDENCE: 72 Ohm
RV LEAD AMPLITUDE: 11.7 mv
RV LEAD IMPEDENCE ICD: 440 Ohm
VENTRICULAR PACING ICD: 1 pct

## 2012-12-27 ENCOUNTER — Encounter: Payer: Self-pay | Admitting: *Deleted

## 2013-02-12 ENCOUNTER — Encounter: Payer: Self-pay | Admitting: Internal Medicine

## 2013-02-12 ENCOUNTER — Ambulatory Visit (INDEPENDENT_AMBULATORY_CARE_PROVIDER_SITE_OTHER): Payer: BC Managed Care – PPO | Admitting: Internal Medicine

## 2013-02-12 VITALS — BP 122/87 | HR 76 | Ht 72.0 in | Wt 195.8 lb

## 2013-02-12 DIAGNOSIS — R69 Illness, unspecified: Secondary | ICD-10-CM

## 2013-02-12 DIAGNOSIS — Z95810 Presence of automatic (implantable) cardiac defibrillator: Secondary | ICD-10-CM

## 2013-02-12 DIAGNOSIS — I469 Cardiac arrest, cause unspecified: Secondary | ICD-10-CM

## 2013-02-12 DIAGNOSIS — Q999 Chromosomal abnormality, unspecified: Secondary | ICD-10-CM

## 2013-02-12 LAB — ICD DEVICE OBSERVATION
DEV-0020ICD: NEGATIVE
PACEART VT: 0
RV LEAD IMPEDENCE ICD: 425 Ohm
RV LEAD THRESHOLD: 1 V
TOT-0008: 0
TOT-0009: 1
TOT-0010: 7

## 2013-02-12 NOTE — Patient Instructions (Addendum)
Remote monitoring is used to monitor your Pacemaker of ICD from home. This monitoring reduces the number of office visits required to check your device to one time per year. It allows [place] to keep an eye on the functioning of your device to ensure it is working properly. You are scheduled for a device check from home on 05/20/13. You may send your transmission at any time that day. If you have a wireless device, the transmission will be sent automatically. After your physician reviews your transmission, you will receive a postcard with your next transmission date.  Your physician recommends that you continue on your current medications as directed. Please refer to the Current Medication list given to you today.

## 2013-02-12 NOTE — Assessment & Plan Note (Addendum)
C./The patient has a gene mutation. We will arrange for genetic counseling again to [facility]. He has 2 children 4 siblings who all the evaluation.   we will plan to reevaluate left ventricular function at his next visit

## 2013-02-12 NOTE — Progress Notes (Signed)
Patient has no care team.   HPI  [name] is a 37 y.o. male Seen in followup for cardiac arrest and is status post ICD implantation. 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  There have been no palpitations, lightheadedness or syncope.    Past Medical History  Diagnosis Date  . Cardiac arrest - ventricular fibrillation     aborted  . ICD (implantable cardiac defibrillator) in place     sudden cardiac arrest/St. Jude  . Cardiac arrest     Past Surgical History  Procedure Laterality Date  . Cardiac defibrillator placement      St. Jude 1257-40  . Cardiac catheterization    . Doppler echocardiography    . Cardiac mri      Current Outpatient Prescriptions  Medication Sig Dispense Refill  . nadolol (CORGARD) 20 MG tablet Take 1 tablet (20 mg total) by mouth daily.  90 tablet  3   No current facility-administered medications for this visit.    No Known Allergies  Review of Systems negative except from HPI and PMH  Physical Exam BP 122/87  Pulse 76  Ht 6' (1.829 m)  Wt 195 lb 12.8 oz (88.814 kg)  BMI 26.55 kg/m2 Well developed and well nourished in no acute distress HENT normal E scleral and icterus clear Neck Supple JVP flat; carotids brisk and full Clear to ausculation Device pocket well healed; without hematoma or erythema Regular rate and rhythm, no murmurs gallops or rub Soft with active bowel sounds No clubbing cyanosis none Edema Alert and oriented, grossly normal motor and sensory function Skin Warm and Dry  ECG demonstrates sinus rhythm at 76 Intervals 16/10/37 Isolated PVC and plan otherwise normal  Assessment and  Plan

## 2013-02-12 NOTE — Assessment & Plan Note (Signed)
The patient's device was interrogated.  The information was reviewed. No changes were made in the programming.    

## 2013-02-12 NOTE — Assessment & Plan Note (Signed)
No recurrent event

## 2013-02-19 ENCOUNTER — Telehealth: Payer: Self-pay | Admitting: Internal Medicine

## 2013-02-19 NOTE — Telephone Encounter (Signed)
New Problem  Pt's wife has an appt for a mamogram and ultrasound and asks if it is okay for him to go to this appt with his wife.

## 2013-02-20 NOTE — Telephone Encounter (Signed)
Advised ok for pt to go per Dr. Graciela Husbands

## 2013-02-26 ENCOUNTER — Encounter: Payer: Self-pay | Admitting: Internal Medicine

## 2013-05-08 ENCOUNTER — Emergency Department (HOSPITAL_COMMUNITY): Payer: BC Managed Care – PPO

## 2013-05-08 ENCOUNTER — Emergency Department (HOSPITAL_COMMUNITY)
Admission: EM | Admit: 2013-05-08 | Discharge: 2013-05-08 | Disposition: A | Discharge status: Home / Self Care | Payer: BC Managed Care – PPO | Attending: Emergency Medicine | Admitting: Emergency Medicine

## 2013-05-08 ENCOUNTER — Encounter (HOSPITAL_COMMUNITY): Payer: Self-pay | Admitting: Emergency Medicine

## 2013-05-08 DIAGNOSIS — R140 Abdominal distension (gaseous): Secondary | ICD-10-CM

## 2013-05-08 DIAGNOSIS — Z79899 Other long term (current) drug therapy: Secondary | ICD-10-CM | POA: Insufficient documentation

## 2013-05-08 DIAGNOSIS — Z95818 Presence of other cardiac implants and grafts: Secondary | ICD-10-CM | POA: Insufficient documentation

## 2013-05-08 DIAGNOSIS — I4901 Ventricular fibrillation: Secondary | ICD-10-CM | POA: Insufficient documentation

## 2013-05-08 DIAGNOSIS — R079 Chest pain, unspecified: Secondary | ICD-10-CM | POA: Insufficient documentation

## 2013-05-08 DIAGNOSIS — Z95810 Presence of automatic (implantable) cardiac defibrillator: Secondary | ICD-10-CM | POA: Insufficient documentation

## 2013-05-08 DIAGNOSIS — Z8679 Personal history of other diseases of the circulatory system: Secondary | ICD-10-CM | POA: Insufficient documentation

## 2013-05-08 DIAGNOSIS — K5900 Constipation, unspecified: Secondary | ICD-10-CM

## 2013-05-08 MED ORDER — POLYETHYLENE GLYCOL 3350 17 G PO PACK: 17.0000 g | PACK | Freq: Every day | ORAL | Status: DC

## 2013-05-08 MED ORDER — MAGNESIUM CITRATE PO SOLN
0.5000 | Freq: Once | ORAL | Status: AC
  Administered 2013-05-08: 0.5 via ORAL
  Filled 2013-05-08: qty 296

## 2013-05-08 MED ORDER — PB-HYOSCY-ATROPINE-SCOPOLAMINE 16.2 MG/5ML PO ELIX
10.0000 mL | ORAL_SOLUTION | Freq: Once | ORAL | Status: AC
  Administered 2013-05-08: 32.4 mg via ORAL
  Filled 2013-05-08 (×2): qty 10

## 2013-05-08 NOTE — ED Notes (Signed)
Dr. Ghim at the bedside.  

## 2013-05-08 NOTE — ED Provider Notes (Signed)
CSN: [id number]     Arrival date & time 05/08/13  4696 History   First MD Initiated Contact with Patient 05/08/13 [phone]     Chief Complaint  Patient presents with  . Chest Pain   (Consider location/radiation/quality/duration/timing/severity/associated sxs/prior Treatment) Patient is a 37 y.o. male presenting with chest pain. The history is provided by the patient.  Chest Pain Pain location:  L lateral chest and R lateral chest Pain quality: sharp, shooting and tightness   Pain radiates to:  Does not radiate Pain radiates to the back: no   Pain severity:  Moderate Onset quality:  Sudden Duration:  2 days Timing:  Intermittent Progression:  Resolved Chronicity:  Recurrent Associated symptoms: AICD problem   Associated symptoms: no cough, no diaphoresis, no dizziness, no nausea, no shortness of breath and not vomiting   Risk factors: male sex   Risk factors: no coronary artery disease, no diabetes mellitus and no smoking     Past Medical History  Diagnosis Date  . Cardiac arrest - ventricular fibrillation     aborted  . ICD (implantable cardiac defibrillator) in place     sudden cardiac arrest/St. Jude  . Cardiac arrest    Past Surgical History  Procedure Laterality Date  . Cardiac defibrillator placement      St. Jude 1257-40  . Cardiac catheterization    . Doppler echocardiography    . Cardiac mri     No family history on file. History  Substance Use Topics  . Smoking status: Never Smoker   . Smokeless tobacco: Never Used  . Alcohol Use: Not on file    Review of Systems  Constitutional: Negative for diaphoresis.  Respiratory: Negative for cough and shortness of breath.   Cardiovascular: Positive for chest pain.  Gastrointestinal: Positive for diarrhea and constipation. Negative for nausea and vomiting.  Neurological: Negative for dizziness and syncope.  All other systems reviewed and are negative.    Allergies  Review of patient's allergies indicates no  known allergies.  Home Medications   Current Outpatient Rx  Name  Route  Sig  Dispense  Refill  . nadolol (CORGARD) 20 MG tablet   Oral   Take 1 tablet (20 mg total) by mouth daily.   90 tablet   3   . polyethylene glycol (MIRALAX / GLYCOLAX) packet   Oral   Take 17 g by mouth daily.   7 each   0    BP 123/86  Pulse 80  Temp(Src) 97.8 F (36.6 C) (Oral)  Resp 18  SpO2 99% Physical Exam  Nursing note and vitals reviewed. Constitutional: He is oriented to person, place, and time. He appears well-developed and well-nourished. No distress.  HENT:  Head: Normocephalic and atraumatic.  Eyes: EOM are normal.  Neck: Normal range of motion. Neck supple.  Cardiovascular: Normal rate, regular rhythm and intact distal pulses.   No murmur heard. Pulmonary/Chest: Effort normal.  Abdominal: Soft. Bowel sounds are normal. He exhibits no distension. There is no tenderness.  Neurological: He is alert and oriented to person, place, and time. Coordination normal.  Skin: Skin is warm and dry. He is not diaphoretic.  Psychiatric: He has a normal mood and affect.    ED Course  Procedures (including critical care time) Labs Review Labs Reviewed - No data to display Imaging Review Dg Chest 2 View  05/08/2013   CLINICAL DATA:  Chest pain  EXAM: CHEST  2 VIEW  COMPARISON:  January 18, 2011  FINDINGS: Lungs are  clear. Heart size and pulmonary vascularity are normal. No adenopathy. No bone lesions. No pneumothorax. Pacemaker lead is attached to the right ventricle.  IMPRESSION: No edema or consolidation.   Electronically Signed   By: Bretta Bang M.D.   On: 05/08/2013 07:52   Dg Abd 1 View  05/08/2013   CLINICAL DATA:  Constipation ; abdominal pain  EXAM: ABDOMEN - 1 VIEW  COMPARISON:  CT abdomen and pelvis July 07, 2004  FINDINGS: There is moderate stool throughout the colon. The bowel gas pattern is unremarkable. No obstruction or free air is seen on this supine examination. No abnormal  calcifications are identified.  IMPRESSION: Moderate stool in colon.  Overall unremarkable bowel gas pattern.   Electronically Signed   By: Bretta Bang M.D.   On: 05/08/2013 07:52    EKG Interpretation    Date/Time:  Wednesday May 08 2013 06:54:54 EST Ventricular Rate:  84 PR Interval:  161 QRS Duration: 99 QT Interval:  362 QTC Calculation: 428 R Axis:   31 Text Interpretation:  Sinus rhythm Consider right atrial enlargement Borderline ECG No significant change since last tracing Confirmed by [facility]  MD, MICHEAL ([id number]) on 05/08/2013 7:31:26 AM           ra sat is 99% and I interpret to be normal  10:53 AM Pt feels improved, awaiting interpretation from St. Jude rep regarding interpretation of device to ensure no complications have occurred recently.     11:37 AM Interrogation shows no events.  tp with PVC's on monitor, but no other abn's of significance.  Rx for miralax provided.  MDM   1. Constipation   2. Abdominal bloating      Pt admits symptoms similar to a few months ago, was constipated then, took bowel cleanser, mag citrate and had felt improved.  He did have diarrhea yesterday, explosive, improved now, stool is still loose, but not explosive.  No nausea, has cramps in upper epigastrium intermittently and seems associated with the pains that are sharp and go into chest.  Pt has no h/o coronary disease, had congenital issue causing Vfib, now with AICD in place.  Pt ahd sharp pains, does not sound like it went off by his history.      Gavin Pound. Delainey Winstanley, MD 05/08/13 1137

## 2013-05-08 NOTE — ED Notes (Addendum)
Pt reports episodes of epigastric/chest pain and feeling of having to have BM. States he is able to go some, but like he's not able to get it all out. States stool is thick and stringy. Once he broke out in cold sweat after using bathroom. Also reports a couple episodes of sharp cp that lasts just a second. Pt has defibrillator. Hx of cardiac arrest. Denies nv, sob. This has been going on x 2 days.

## 2013-05-08 NOTE — ED Notes (Signed)
St. Jude representative at the bedside to interrogate.

## 2013-05-08 NOTE — Discharge Instructions (Signed)
 Constipation, Adult Constipation is when a person has fewer than 3 bowel movements a week; has difficulty having a bowel movement; or has stools that are dry, hard, or larger than normal. As people grow older, constipation is more common. If you try to fix constipation with medicines that make you have a bowel movement (laxatives), the problem may get worse. Long-term laxative use may cause the muscles of the colon to become weak. A low-fiber diet, not taking in enough fluids, and taking certain medicines may make constipation worse. CAUSES   Certain medicines, such as antidepressants, pain medicine, iron supplements, antacids, and water pills.   Certain diseases, such as diabetes, irritable bowel syndrome (IBS), thyroid  disease, or depression.   Not drinking enough water.   Not eating enough fiber-rich foods.   Stress or travel.  Lack of physical activity or exercise.  Not going to the restroom when there is the urge to have a bowel movement.  Ignoring the urge to have a bowel movement.  Using laxatives too much. SYMPTOMS   Having fewer than 3 bowel movements a week.   Straining to have a bowel movement.   Having hard, dry, or larger than normal stools.   Feeling full or bloated.   Pain in the lower abdomen.  Not feeling relief after having a bowel movement. DIAGNOSIS  Your caregiver will take a medical history and perform a physical exam. Further testing may be done for severe constipation. Some tests may include:   A barium enema X-ray to examine your rectum, colon, and sometimes, your small intestine.  A sigmoidoscopy to examine your lower colon.  A colonoscopy to examine your entire colon. TREATMENT  Treatment will depend on the severity of your constipation and what is causing it. Some dietary treatments include drinking more fluids and eating more fiber-rich foods. Lifestyle treatments may include regular exercise. If these diet and lifestyle recommendations  do not help, your caregiver may recommend taking over-the-counter laxative medicines to help you have bowel movements. Prescription medicines may be prescribed if over-the-counter medicines do not work.  HOME CARE INSTRUCTIONS   Increase dietary fiber in your diet, such as fruits, vegetables, whole grains, and beans. Limit high-fat and processed sugars in your diet, such as [place] fries, hamburgers, cookies, candies, and soda.   A fiber supplement may be added to your diet if you cannot get enough fiber from foods.   Drink enough fluids to keep your urine clear or pale yellow.   Exercise regularly or as directed by your caregiver.   Go to the restroom when you have the urge to go. Do not hold it.  Only take medicines as directed by your caregiver. Do not take other medicines for constipation without talking to your caregiver first. SEEK IMMEDIATE MEDICAL CARE IF:   You have bright red blood in your stool.   Your constipation lasts for more than 4 days or gets worse.   You have abdominal or rectal pain.   You have thin, pencil-like stools.  You have unexplained weight loss. MAKE SURE YOU:   Understand these instructions.  Will watch your condition.  Will get help right away if you are not doing well or get worse. Document Released: 02/19/2004 Document Revised: 08/15/2011 Document Reviewed: 04/26/2011 [facility] Patient Information 2014 [place], [place].    Bloating Bloating is the feeling of fullness in your belly. You may feel as though your pants are too tight. Often the cause of bloating is overeating, retaining fluids, or having gas in  your bowel. It is also caused by swallowing air and eating foods that cause gas. Irritable bowel syndrome is one of the most common causes of bloating. Constipation is also a common cause. Sometimes more serious problems can cause bloating. SYMPTOMS  Usually there is a feeling of fullness, as though your abdomen is bulged out. There may  be mild discomfort.  DIAGNOSIS  Usually no particular testing is necessary for most bloating. If the condition persists and seems to become worse, your caregiver may do additional testing.  TREATMENT   There is no direct treatment for bloating.  Do not put gas into the bowel. Avoid chewing gum and sucking on candy. These tend to make you swallow air. Swallowing air can also be a nervous habit. Try to avoid this.  Avoiding high residue diets will help. Eat foods with soluble fibers (examples include root vegetables, apples, or barley) and substitute dairy products with soy and rice products. This helps irritable bowel syndrome.  If constipation is the cause, then a high residue diet with more fiber will help.  Avoid carbonated beverages.  Over-the-counter preparations are available that help reduce gas. Your pharmacist can help you with this. SEEK MEDICAL CARE IF:   Bloating continues and seems to be getting worse.  You notice a weight gain.  You have a weight loss but the bloating is getting worse.  You have changes in your bowel habits or develop nausea or vomiting. SEEK IMMEDIATE MEDICAL CARE IF:   You develop shortness of breath or swelling in your legs.  You have an increase in abdominal pain or develop chest pain. Document Released: 03/23/2006 Document Revised: 08/15/2011 Document Reviewed: 05/11/2007 [facility] Patient Information 2014 [place], [place].

## 2013-05-20 ENCOUNTER — Ambulatory Visit (INDEPENDENT_AMBULATORY_CARE_PROVIDER_SITE_OTHER): Payer: BC Managed Care – PPO | Admitting: *Deleted

## 2013-05-20 ENCOUNTER — Encounter: Payer: Self-pay | Admitting: Internal Medicine

## 2013-05-20 DIAGNOSIS — I469 Cardiac arrest, cause unspecified: Secondary | ICD-10-CM

## 2013-05-26 LAB — MDC_IDC_ENUM_SESS_TYPE_REMOTE
Battery Remaining Longevity: 88 mo
Battery Remaining Percentage: 80 %
Battery Voltage: 2.99 V
Brady Statistic RV Percent Paced: 0 %
Implantable Pulse Generator Serial Number: 1017632
Lead Channel Impedance Value: 440 Ohm
Lead Channel Pacing Threshold Amplitude: 1 V
Lead Channel Pacing Threshold Pulse Width: 0.5 ms
Lead Channel Sensing Intrinsic Amplitude: 11.7 mV
Lead Channel Setting Sensing Sensitivity: 0.5 mV

## 2013-06-19 ENCOUNTER — Encounter: Payer: Self-pay | Admitting: *Deleted

## 2013-06-20 ENCOUNTER — Encounter: Payer: Self-pay | Admitting: Internal Medicine

## 2013-07-12 ENCOUNTER — Other Ambulatory Visit: Payer: Self-pay | Admitting: *Deleted

## 2013-07-12 MED ORDER — NADOLOL 20 MG PO TABS
20.0000 mg | ORAL_TABLET | Freq: Every day | ORAL | Status: DC
Start: 2013-07-12 — End: 2014-05-22

## 2013-08-21 ENCOUNTER — Ambulatory Visit (INDEPENDENT_AMBULATORY_CARE_PROVIDER_SITE_OTHER): Payer: BC Managed Care – PPO | Admitting: *Deleted

## 2013-08-21 ENCOUNTER — Encounter: Payer: Self-pay | Admitting: Internal Medicine

## 2013-08-21 DIAGNOSIS — I469 Cardiac arrest, cause unspecified: Secondary | ICD-10-CM

## 2013-08-23 LAB — MDC_IDC_ENUM_SESS_TYPE_REMOTE
Brady Statistic RV Percent Paced: 1 %
HighPow Impedance: 72 Ohm
HighPow Impedance: 72 Ohm
Lead Channel Impedance Value: 440 Ohm
Lead Channel Pacing Threshold Amplitude: 1 V
Lead Channel Pacing Threshold Pulse Width: 0.5 ms
Lead Channel Sensing Intrinsic Amplitude: 11.7 mV
Lead Channel Setting Pacing Amplitude: 2.5 V
Lead Channel Setting Pacing Pulse Width: 0.5 ms
Lead Channel Setting Sensing Sensitivity: 0.5 mV
MDC IDC MSMT BATTERY REMAINING LONGEVITY: 85 mo
MDC IDC MSMT BATTERY REMAINING PERCENTAGE: 78 %
MDC IDC MSMT BATTERY VOLTAGE: 2.98 V
MDC IDC PG SERIAL: 1017632
MDC IDC SESS DTM: 20150318060037
Zone Setting Detection Interval: 270 ms

## 2013-09-02 NOTE — Progress Notes (Signed)
ICD remote 

## 2013-09-04 ENCOUNTER — Encounter: Payer: Self-pay | Admitting: *Deleted

## 2013-11-07 ENCOUNTER — Telehealth: Payer: Self-pay | Admitting: Internal Medicine

## 2013-11-07 NOTE — Telephone Encounter (Signed)
Pt tells me that for a couple of weeks he has experienced left-sided shoulder pain that would go away and come back. Not currently having those symptoms in left arm. Current symptoms include tingling fingers, feels hot between his elbow and wrist. He has discomfort in his right arm, "feels uncomfortable". He has also not been able to sleep well the last few days. He has made an appt with [facility] to be evaluated - appt tomorrow. Will review symptoms with Dr. Graciela Husbands for recommendations. Pt is agreeable to plan.

## 2013-11-07 NOTE — Telephone Encounter (Signed)
New problem   Pt is having tingling in left fingers and rt hand went numb last night. Please call pt.

## 2013-11-25 ENCOUNTER — Encounter: Payer: Self-pay | Admitting: Internal Medicine

## 2013-11-25 ENCOUNTER — Ambulatory Visit (INDEPENDENT_AMBULATORY_CARE_PROVIDER_SITE_OTHER): Payer: BC Managed Care – PPO | Admitting: *Deleted

## 2013-11-25 DIAGNOSIS — Z95810 Presence of automatic (implantable) cardiac defibrillator: Secondary | ICD-10-CM

## 2013-11-25 DIAGNOSIS — I469 Cardiac arrest, cause unspecified: Secondary | ICD-10-CM

## 2013-11-25 LAB — MDC_IDC_ENUM_SESS_TYPE_REMOTE
Battery Remaining Longevity: 83 mo
Battery Remaining Percentage: 75 %
Battery Voltage: 2.98 V
Date Time Interrogation Session: 20150622060017
HIGH POWER IMPEDANCE MEASURED VALUE: 69 Ohm
HighPow Impedance: 69 Ohm
Lead Channel Impedance Value: 450 Ohm
Lead Channel Pacing Threshold Amplitude: 1 V
Lead Channel Setting Sensing Sensitivity: 0.5 mV
MDC IDC MSMT LEADCHNL RV PACING THRESHOLD PULSEWIDTH: 0.5 ms
MDC IDC MSMT LEADCHNL RV SENSING INTR AMPL: 11.7 mV
MDC IDC PG SERIAL: 1017632
MDC IDC SET LEADCHNL RV PACING AMPLITUDE: 2.5 V
MDC IDC SET LEADCHNL RV PACING PULSEWIDTH: 0.5 ms
MDC IDC SET ZONE DETECTION INTERVAL: 270 ms
MDC IDC STAT BRADY RV PERCENT PACED: 1 %

## 2013-11-25 NOTE — Progress Notes (Signed)
Remote ICD transmission.   

## 2013-12-10 ENCOUNTER — Encounter: Payer: Self-pay | Admitting: Cardiology

## 2014-03-14 ENCOUNTER — Encounter: Payer: Self-pay | Admitting: *Deleted

## 2014-05-22 ENCOUNTER — Other Ambulatory Visit: Payer: Self-pay | Admitting: *Deleted

## 2014-05-22 MED ORDER — NADOLOL 20 MG PO TABS: 20.0000 mg | ORAL_TABLET | Freq: Every day | ORAL | Status: DC

## 2014-06-10 ENCOUNTER — Ambulatory Visit (INDEPENDENT_AMBULATORY_CARE_PROVIDER_SITE_OTHER): Payer: BC Managed Care – PPO | Admitting: Internal Medicine

## 2014-06-10 ENCOUNTER — Encounter: Payer: Self-pay | Admitting: Internal Medicine

## 2014-06-10 VITALS — BP 104/78 | HR 82 | Ht 71.0 in | Wt 205.4 lb

## 2014-06-10 DIAGNOSIS — Z4502 Encounter for adjustment and management of automatic implantable cardiac defibrillator: Secondary | ICD-10-CM | POA: Diagnosis not present

## 2014-06-10 DIAGNOSIS — I469 Cardiac arrest, cause unspecified: Secondary | ICD-10-CM | POA: Diagnosis not present

## 2014-06-10 LAB — MDC_IDC_ENUM_SESS_TYPE_INCLINIC
Date Time Interrogation Session: 20160105150543
HIGH POWER IMPEDANCE MEASURED VALUE: 69.75 Ohm
Implantable Pulse Generator Serial Number: 1017632
Lead Channel Impedance Value: 425 Ohm
Lead Channel Pacing Threshold Amplitude: 0.75 V
Lead Channel Pacing Threshold Amplitude: 0.75 V
Lead Channel Pacing Threshold Pulse Width: 0.5 ms
Lead Channel Sensing Intrinsic Amplitude: 11.7 mV
Lead Channel Setting Pacing Pulse Width: 0.5 ms
Lead Channel Setting Sensing Sensitivity: 0.5 mV
MDC IDC MSMT BATTERY REMAINING LONGEVITY: 78 mo
MDC IDC MSMT LEADCHNL RV PACING THRESHOLD PULSEWIDTH: 0.5 ms
MDC IDC SET LEADCHNL RV PACING AMPLITUDE: 2.5 V
MDC IDC SET ZONE DETECTION INTERVAL: 270 ms
MDC IDC STAT BRADY RV PERCENT PACED: 0 %
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
## 2014-06-10 NOTE — Patient Instructions (Addendum)
Your physician has recommended you make the following change in your medication:  1) STOP Corgard  Please get blood culture labs - handwritten prescription given to you today  Your physician recommends that you schedule a follow-up appointment in: 4 months with Dr. Graciela HusbandsKlein.

## 2014-06-11 ENCOUNTER — Telehealth: Payer: Self-pay | Admitting: Internal Medicine

## 2014-06-11 NOTE — Telephone Encounter (Signed)
New message      Pt c/o medication issue: 1. Name of Medication: Nadalol 2. How are you currently taking this medication (dosage and times per day)? Daily 3. Are you having a reaction (difficulty breathing--STAT)?   4. What is your medication issue? Pt read that you should not stop taking medication all of a suddenly.  Please call pt at [phone].  (Dr Graciela HusbandsKlein told pt to stop taking medication)

## 2014-06-11 NOTE — Telephone Encounter (Signed)
Ok per patient to speak with wife. Informed patient may stop without weaning because it was low dose.  But if he is more comfortable he can take qod for 5 days, then stop. Patient's wife verbalized understanding and agreeable to plan.

## 2014-06-30 ENCOUNTER — Encounter: Payer: Self-pay | Admitting: Internal Medicine

## 2014-09-09 ENCOUNTER — Ambulatory Visit (INDEPENDENT_AMBULATORY_CARE_PROVIDER_SITE_OTHER): Payer: BC Managed Care – PPO | Admitting: *Deleted

## 2014-09-09 DIAGNOSIS — I469 Cardiac arrest, cause unspecified: Secondary | ICD-10-CM | POA: Diagnosis not present

## 2014-09-09 NOTE — Progress Notes (Signed)
Remote ICD transmission.   

## 2014-09-10 LAB — MDC_IDC_ENUM_SESS_TYPE_REMOTE
Battery Remaining Percentage: 69 %
Battery Voltage: 2.96 V
Date Time Interrogation Session: 20160405080018
HIGH POWER IMPEDANCE MEASURED VALUE: 72 Ohm
HighPow Impedance: 72 Ohm
Implantable Pulse Generator Serial Number: 1017632
Lead Channel Impedance Value: 450 Ohm
Lead Channel Pacing Threshold Amplitude: 0.75 V
Lead Channel Pacing Threshold Pulse Width: 0.5 ms
Lead Channel Setting Pacing Amplitude: 2.5 V
Lead Channel Setting Pacing Pulse Width: 0.5 ms
Lead Channel Setting Sensing Sensitivity: 0.5 mV
MDC IDC MSMT BATTERY REMAINING LONGEVITY: 74 mo
MDC IDC MSMT LEADCHNL RV SENSING INTR AMPL: 11.7 mV
MDC IDC SET ZONE DETECTION INTERVAL: 270 ms
MDC IDC STAT BRADY RV PERCENT PACED: 0 %

## 2014-10-07 ENCOUNTER — Encounter: Payer: BC Managed Care – PPO | Admitting: Internal Medicine

## 2014-10-08 ENCOUNTER — Ambulatory Visit (INDEPENDENT_AMBULATORY_CARE_PROVIDER_SITE_OTHER): Payer: BC Managed Care – PPO | Admitting: Internal Medicine

## 2014-10-08 ENCOUNTER — Encounter: Payer: Self-pay | Admitting: Internal Medicine

## 2014-10-08 VITALS — BP 132/84 | HR 108 | Ht 71.0 in | Wt 208.2 lb

## 2014-10-08 DIAGNOSIS — I471 Supraventricular tachycardia: Secondary | ICD-10-CM

## 2014-10-08 DIAGNOSIS — I469 Cardiac arrest, cause unspecified: Secondary | ICD-10-CM

## 2014-10-08 LAB — CUP PACEART INCLINIC DEVICE CHECK
Battery Remaining Longevity: 73.2 mo
Brady Statistic RV Percent Paced: 0 %
Date Time Interrogation Session: 20160504142312
HIGH POWER IMPEDANCE MEASURED VALUE: 68.625
Lead Channel Impedance Value: 450 Ohm
Lead Channel Pacing Threshold Amplitude: 0.75 V
Lead Channel Pacing Threshold Pulse Width: 0.5 ms
Lead Channel Sensing Intrinsic Amplitude: 11.7 mV
Lead Channel Setting Pacing Pulse Width: 0.5 ms
Lead Channel Setting Sensing Sensitivity: 0.5 mV
MDC IDC SET LEADCHNL RV PACING AMPLITUDE: 2.5 V
Pulse Gen Serial Number: 1017632
Zone Setting Detection Interval: 270 ms

## 2014-10-08 LAB — CBC
HEMATOCRIT: 44.7 % (ref 39.0–52.0)
Hemoglobin: 15.3 g/dL (ref 13.0–17.0)
MCHC: 34.1 g/dL (ref 30.0–36.0)
MCV: 88.9 fl (ref 78.0–100.0)
PLATELETS: 249 10*3/uL (ref 150.0–400.0)
RBC: 5.03 Mil/uL (ref 4.22–5.81)
RDW: 12.9 % (ref 11.5–15.5)
WBC: 4.7 10*3/uL (ref 4.0–10.5)

## 2014-10-08 LAB — TSH: TSH: 0.98 u[IU]/mL (ref 0.35–4.50)

## 2014-10-08 MED ORDER — PROPRANOLOL HCL ER 60 MG PO CP24: 60.0000 mg | ORAL_CAPSULE | Freq: Every day | ORAL | Status: DC

## 2014-10-08 NOTE — Progress Notes (Signed)
Patient Care Team: No Pcp Per Patient as PCP - General (General Practice)   HPI  [name] is a 39 y.o. male Seen in followup for cardiac arrest and is status post ICD implantation. 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  There have been no palpitations, lightheadedness or syncope.   He has complaints of sweats at night and with sex  Wonders whether it is related to nadolol     MRI 8/12 demonstrated mild impairment in LV systolic function at 49%.  Past Medical History  Diagnosis Date  . Cardiac arrest - ventricular fibrillation     aborted  . ICD (implantable cardiac defibrillator) in place     sudden cardiac arrest/St. Jude  . Cardiac arrest     Past Surgical History  Procedure Laterality Date  . Cardiac defibrillator placement      St. Jude 1257-40  . Cardiac catheterization    . Doppler echocardiography    . Cardiac mri      No current outpatient prescriptions on file.   No current facility-administered medications for this visit.    No Known Allergies  Review of Systems negative except from HPI and PMH  Physical Exam BP 132/84 mmHg  Pulse 108  Ht 5\' 11"  (1.803 m)  Wt 208 lb 3.2 oz (94.439 kg)  BMI 29.05 kg/m2 Well developed and well nourished in no acute distress HENT normal E scleral and icterus clear Neck Supple JVP flat; carotids brisk and full Clear to ausculation Device pocket well healed; without hematoma or erythema Regular rate and rhythm, no murmurs gallops or rub Soft with active bowel sounds No clubbing cyanosis none Edema Alert and oriented, grossly normal motor and sensory function Skin Warm and Dry  ECG demonstrates sinus rhythm at 106  Intervals .  12/14/35    Assessment and  Plan  Aborted sudden death No intercurrent Ventricular tachycardia  Famhx of Lamin  Cardiomyopathy  ICD  The patient's device was interrogated.  The information was reviewed. No changes were made in the programming.    sinus tachycardia   His heart rate is significantly increased since the nadolol was discontinued in January because of issues related to sweats and ED. these had abated. We will check thyroid and CBC to see if there is a secondary cause for his tachycardia. We will also resume a beta blocker however this time in the form of Inderal LA 60. We will see him in 3 months to see how he is tolerating the beta blocker.  We will repeat assessment of left ventricular function is his may inform drug therapy

## 2014-10-08 NOTE — Patient Instructions (Addendum)
Medication Instructions:   START TAKING INDERAL 60 MG ONCE A DAY   Labwork:  TSH AND CBC   Testing/Procedures:   Follow-Up:  Your physician has requested that you have an echocardiogram. Echocardiography is a painless test that uses sound waves to create images of your heart. It provides your doctor with information about the size and shape of your heart and how well your heart's chambers and valves are working. This procedure takes approximately one hour. There are no restrictions for this procedure.    Your physician recommends that you schedule a follow-up appointment in:  IN 3 MONTHS WITH AMBER    Remote monitoring is used to monitor your Pacemaker of ICD from home. AUGUST 3 2016This monitoring reduces the number of office visits required to check your device to one time per year. It allows us to keep an eye on the functioning of your device to ensure it is working properly. You are scheduled for a device check from home on. You may send your transmission at any time that day. If you have a wireless device, the transmission will be sent automatically. After your physician reviews your transmission, you will receive a postcard with your next transmission date.    Any Other Special Instructions Will Be Listed Below (If Applicable).

## 2014-10-14 ENCOUNTER — Encounter: Payer: Self-pay | Admitting: Cardiology

## 2014-10-14 ENCOUNTER — Encounter: Payer: Self-pay | Admitting: Internal Medicine

## 2014-10-16 ENCOUNTER — Encounter: Payer: Self-pay | Admitting: Internal Medicine

## 2014-10-17 ENCOUNTER — Ambulatory Visit (HOSPITAL_COMMUNITY): Payer: BC Managed Care – PPO | Attending: Internal Medicine

## 2014-10-17 ENCOUNTER — Other Ambulatory Visit: Payer: Self-pay

## 2014-10-17 DIAGNOSIS — I471 Supraventricular tachycardia, unspecified: Secondary | ICD-10-CM

## 2014-10-17 DIAGNOSIS — Z8674 Personal history of sudden cardiac arrest: Secondary | ICD-10-CM | POA: Insufficient documentation

## 2015-01-07 ENCOUNTER — Ambulatory Visit (INDEPENDENT_AMBULATORY_CARE_PROVIDER_SITE_OTHER): Payer: BC Managed Care – PPO | Admitting: *Deleted

## 2015-01-07 DIAGNOSIS — I469 Cardiac arrest, cause unspecified: Secondary | ICD-10-CM | POA: Diagnosis not present

## 2015-01-07 LAB — CUP PACEART REMOTE DEVICE CHECK
Battery Remaining Longevity: 6.1
Date Time Interrogation Session: 20160808095254
HighPow Impedance: 71 Ohm
Lead Channel Impedance Value: 450 Ohm
Lead Channel Setting Pacing Amplitude: 2.5 V
Lead Channel Setting Pacing Pulse Width: 0.5 ms
MDC IDC MSMT BATTERY REMAINING PERCENTAGE: 67 %
MDC IDC MSMT LEADCHNL RV SENSING INTR AMPL: 11.7 mV
MDC IDC SET LEADCHNL RV SENSING SENSITIVITY: 0.5 mV
MDC IDC STAT BRADY RV PERCENT PACED: 1 % — AB
Pulse Gen Serial Number: 1017632
Zone Setting Detection Interval: 270 ms

## 2015-01-07 NOTE — Progress Notes (Signed)
Remote pacemaker transmission.   

## 2015-01-11 ENCOUNTER — Encounter: Payer: Self-pay | Admitting: Nurse Practitioner

## 2015-01-11 NOTE — Progress Notes (Signed)
Electrophysiology Office Note Date: 01/12/2015  ID:  [name], DOB [birth year], MRN [id number]  PCP: No PCP Per Patient Electrophysiologist: Graciela Husbands  CC: Routine ICD follow-up/follow up on sinus tachycardia  [name] is a 39 y.o. male seen today for Dr Graciela Husbands. At his last office visit in May of this year, he was noted to have right shifted histograms and sinus tachycardia.  He had previously been intolerant of Nadolol 2/2 night sweats and ED.  He was started on Inderal with planned follow up today.  Echocardiogram was obtained and demonstrated EF 55-60% with mild MR.  CBC and TSH were normal.  Since last being seen in our clinic, the patient reports doing very well. He denies chest pain, palpitations, dyspnea, PND, orthopnea, nausea, vomiting, dizziness, syncope, edema, weight gain, or early satiety.  He has not had ICD shocks.   Device History: STJ single chamber ICD implanted 2012 for aborted cardiac arrest History of appropriate therapy: No History of AAD therapy: No   Past Medical History  Diagnosis Date  . Cardiac arrest - ventricular fibrillation     a. s/p STJ ICD  . Sinus tachycardia    Past Surgical History  Procedure Laterality Date  . Cardiac defibrillator placement      St. Jude 1257-40  . Cardiac catheterization    . Doppler echocardiography    . Cardiac mri      Current Outpatient Prescriptions  Medication Sig Dispense Refill  . propranolol ER (INDERAL LA) 60 MG 24 hr capsule Take 1 capsule (60 mg total) by mouth daily. 30 capsule 6   No current facility-administered medications for this visit.    Allergies:   Review of patient's allergies indicates no known allergies.   Social History: History   Social History  . Marital Status: Married    Spouse Name: [name]  . Number of Children: [name]  . Years of Education: [name]   Occupational History  . Not on file.   Social History Main Topics  . Smoking status: Never Smoker   . Smokeless tobacco:  Never Used  . Alcohol Use: Not on file  . Drug Use: Not on file  . Sexual Activity: Not on file   Other Topics Concern  . Not on file   Social History Narrative    Family History: Family History  Problem Relation Age of Onset  . Thrombosis Mother     blood clot in leg    Review of Systems: All other systems reviewed and are otherwise negative except as noted above.   Physical Exam: VS:  BP 142/80 mmHg  Pulse 70  Ht 6' (1.829 m)  Wt 201 lb 6.4 oz (91.354 kg)  BMI 27.31 kg/m2  SpO2 98% , BMI Body mass index is 27.31 kg/(m^2).  GEN- The patient is well appearing, alert and oriented x 3 today.   HEENT: normocephalic, atraumatic; sclera clear, conjunctiva pink; hearing intact; oropharynx clear; neck supple  Lungs- Clear to ausculation bilaterally, normal work of breathing.  No wheezes, rales, rhonchi Heart- Regular rate and rhythm, no murmurs, rubs or gallops  GI- soft, non-tender, non-distended, bowel sounds present  Extremities- no clubbing, cyanosis, or edema; DP/PT/radial pulses 2+ bilaterally MS- no significant deformity or atrophy Skin- warm and dry, no rash or lesion; ICD pocket well healed Psych- euthymic mood, full affect Neuro- strength and sensation are intact  ICD interrogation- not interrogated today, recent remote reviewed.   EKG:  EKG is not ordered today.  Recent Labs:  10/08/2014: Hemoglobin 15.3; Platelets 249.0; TSH 0.98   Wt Readings from Last 3 Encounters:  01/12/15 201 lb 6.4 oz (91.354 kg)  10/08/14 208 lb 3.2 oz (94.439 kg)  06/10/14 205 lb 6.4 oz (93.169 kg)     Other studies Reviewed: Additional studies/ records that were reviewed today include: Dr Odessa Fleming note, recent remote interrogation  Assessment and Plan:  1.  Aborted cardiac arrest No recurrent ventricular arrhythmias Normal ICD function See Pace Art report No changes today  2.  Sinus tachycardia Heart rates are improved on Inderal which he has tolerated Continue Inderal for  now Recent CBC, TSH, echo normal  3.  HTN BP slightly elevated today - the patient ate a ham biscuit this morning He will monitor BP at home, if consistently running >140/90, he will call for medication adjustments Advised low salt diet and aerobic exercise for 30 minutes per day/5 days per week   Current medicines are reviewed at length with the patient today.   The patient does not have concerns regarding his medicines.  The following changes were made today:  none  Labs/ tests ordered today include:none    Disposition:   Follow up with Merlin transmissions, follow up with Dr Graciela Husbands in 1 year   Signed, Gypsy Balsam, NP 01/12/2015 8:39 AM  [facility] [address] [place] [address] [phone] (office) [phone] (fax)

## 2015-01-12 ENCOUNTER — Ambulatory Visit (INDEPENDENT_AMBULATORY_CARE_PROVIDER_SITE_OTHER): Payer: BC Managed Care – PPO | Admitting: Nurse Practitioner

## 2015-01-12 ENCOUNTER — Encounter: Payer: Self-pay | Admitting: Nurse Practitioner

## 2015-01-12 VITALS — BP 142/80 | HR 70 | Ht 72.0 in | Wt 201.4 lb

## 2015-01-12 DIAGNOSIS — I469 Cardiac arrest, cause unspecified: Secondary | ICD-10-CM

## 2015-01-12 DIAGNOSIS — I471 Supraventricular tachycardia: Secondary | ICD-10-CM | POA: Diagnosis not present

## 2015-01-12 DIAGNOSIS — R000 Tachycardia, unspecified: Secondary | ICD-10-CM

## 2015-01-12 NOTE — Patient Instructions (Signed)
Medication Instructions:   Your physician recommends that you continue on your current medications as directed. Please refer to the Current Medication list given to you today.   Labwork:  NONE ORDER TODAY   Testing/Procedures:  NONE ORDER TODAY   Follow-Up:  Your physician wants you to follow-up in: ONE YEAR WITH  DR Logan Bores will receive a reminder letter in the mail two months in advance. If you don't receive a letter, please call our office to schedule the follow-up appointment.  Remote monitoring is used to monitor your Pacemaker of ICD from home. This monitoring reduces the number of office visits required to check your device to one time per year. It allows [place] to keep an eye on the functioning of your device to ensure it is working properly. You are scheduled for a device check from home on . 11 / 7  / 16 You may send your transmission at any time that day. If you have a wireless device, the transmission will be sent automatically. After your physician reviews your transmission, you will receive a postcard with your next transmission date.     Any Other Special Instructions Will Be Listed Below (If Applicable).  CALL IF BLOOD PRESSURE RUNNING HIGHER THAN 140/90

## 2015-02-05 ENCOUNTER — Encounter: Payer: Self-pay | Admitting: Cardiology

## 2015-02-19 ENCOUNTER — Encounter: Payer: Self-pay | Admitting: Internal Medicine

## 2015-04-13 ENCOUNTER — Ambulatory Visit (INDEPENDENT_AMBULATORY_CARE_PROVIDER_SITE_OTHER): Payer: Self-pay | Admitting: *Deleted

## 2015-04-13 DIAGNOSIS — I469 Cardiac arrest, cause unspecified: Secondary | ICD-10-CM

## 2015-04-13 LAB — CUP PACEART REMOTE DEVICE CHECK
Battery Remaining Longevity: 71 mo
Battery Voltage: 2.96 V
Brady Statistic RV Percent Paced: 0 %
Date Time Interrogation Session: 20161107070014
HIGH POWER IMPEDANCE MEASURED VALUE: 72 Ohm
HighPow Impedance: 72 Ohm
Implantable Lead Implant Date: 20120813
Implantable Lead Location: 753860
Implantable Lead Model: 181
Lead Channel Setting Pacing Pulse Width: 0.5 ms
Lead Channel Setting Sensing Sensitivity: 0.5 mV
MDC IDC LEAD SERIAL: 308767
MDC IDC MSMT BATTERY REMAINING PERCENTAGE: 64 %
MDC IDC MSMT LEADCHNL RV IMPEDANCE VALUE: 450 Ohm
MDC IDC MSMT LEADCHNL RV SENSING INTR AMPL: 11.7 mV
MDC IDC SET LEADCHNL RV PACING AMPLITUDE: 2.5 V
Pulse Gen Serial Number: 1017632

## 2015-04-13 NOTE — Progress Notes (Signed)
Remote ICD transmission.   

## 2015-04-14 ENCOUNTER — Encounter: Payer: Self-pay | Admitting: Cardiology

## 2015-05-04 ENCOUNTER — Telehealth: Payer: Self-pay | Admitting: Internal Medicine

## 2015-05-04 NOTE — Telephone Encounter (Signed)
Returned patient's call.  Reviewed letter with patient and explained that he is not dependent on his device based on all of his previous device checks and that if he notices his device vibrating, to call us.  Advised patient to keep Merlin transmitter plugged in next to his bed and advised that his device checks his battery and leads daily and will send the alert to us automatically if his device detects battery depletion/ERI.  Patient is appreciative of explanation and denies additional questions or concerns at this time.  He voices understanding of all instructions.

## 2015-05-04 NOTE — Telephone Encounter (Signed)
New Message   Pt received a certified letter stating that his device battery would need to be changed. Pt requests a call back for clarification

## 2015-07-03 ENCOUNTER — Other Ambulatory Visit: Payer: Self-pay | Admitting: Internal Medicine

## 2015-07-13 ENCOUNTER — Ambulatory Visit (INDEPENDENT_AMBULATORY_CARE_PROVIDER_SITE_OTHER): Payer: Self-pay | Admitting: *Deleted

## 2015-07-13 DIAGNOSIS — I469 Cardiac arrest, cause unspecified: Secondary | ICD-10-CM

## 2015-07-14 NOTE — Progress Notes (Signed)
Remote ICD transmission.   

## 2015-08-14 LAB — CUP PACEART REMOTE DEVICE CHECK
Battery Remaining Longevity: 68 mo
Battery Remaining Percentage: 62 %
Battery Voltage: 2.96 V
Date Time Interrogation Session: 20170206070021
HIGH POWER IMPEDANCE MEASURED VALUE: 72 Ohm
HIGH POWER IMPEDANCE MEASURED VALUE: 72 Ohm
Implantable Lead Implant Date: 20120813
Implantable Lead Model: 181
Implantable Lead Serial Number: 308767
Lead Channel Impedance Value: 450 Ohm
Lead Channel Pacing Threshold Pulse Width: 0.5 ms
Lead Channel Sensing Intrinsic Amplitude: 11.7 mV
Lead Channel Setting Pacing Pulse Width: 0.5 ms
MDC IDC LEAD LOCATION: 753860
MDC IDC MSMT LEADCHNL RV PACING THRESHOLD AMPLITUDE: 1 V
MDC IDC PG SERIAL: 1017632
MDC IDC SET LEADCHNL RV PACING AMPLITUDE: 2.5 V
MDC IDC SET LEADCHNL RV SENSING SENSITIVITY: 0.5 mV
MDC IDC STAT BRADY RV PERCENT PACED: 0 %

## 2015-08-19 ENCOUNTER — Encounter: Payer: Self-pay | Admitting: Cardiology

## 2016-01-05 ENCOUNTER — Encounter: Payer: Self-pay | Admitting: Physician Assistant

## 2016-01-05 ENCOUNTER — Encounter (INDEPENDENT_AMBULATORY_CARE_PROVIDER_SITE_OTHER): Payer: Self-pay

## 2016-01-05 ENCOUNTER — Ambulatory Visit (INDEPENDENT_AMBULATORY_CARE_PROVIDER_SITE_OTHER): Payer: Self-pay | Admitting: Physician Assistant

## 2016-01-05 VITALS — BP 133/88 | HR 91 | Ht 71.0 in | Wt 201.6 lb

## 2016-01-05 DIAGNOSIS — I469 Cardiac arrest, cause unspecified: Secondary | ICD-10-CM

## 2016-01-05 NOTE — Progress Notes (Signed)
Cardiology Office Note Date:  01/05/2016  Patient ID:  [name], DOB 03-02-1976, MRN [id number] PCP:  No PCP Per Patient  Electrophysiologist:  Dr. Graciela Husbands   Chief Complaint: routine clinic/device visit  History of Present Illness: [name] is a 40 y.o. male with history of aborted cardiac arrest 2012, he was found on genetic screening to have a Lamin  Mutation, Dr. Odessa Fleming notes reviewed noting children/siblings have not been tested sinus tachycardia improved back on BB.  Last seen by EP service, Gypsy Balsam, NP 01/12/15, at that time doing well .     He comes in today to be seen for Dr. Graciela Husbands, is feeling very well, denies any kind of CP, palpitations or SOB, no dizziness, near syncope or syncope,no shocks from his device.  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  No weakness   Device History: STJ single chamber ICD implanted 2012 for aborted cardiac arrest History of appropriate therapy: No History of AAD therapy: No  Past Medical History:  Diagnosis Date  . Cardiac arrest - ventricular fibrillation    a. s/p STJ ICD  . Sinus tachycardia (HCC)     Past Surgical History:  Procedure Laterality Date  . CARDIAC CATHETERIZATION    . CARDIAC DEFIBRILLATOR PLACEMENT     St. Jude 1257-40  . cardiac MRI    . DOPPLER ECHOCARDIOGRAPHY      Current Outpatient Prescriptions  Medication Sig Dispense Refill  . propranolol ER (INDERAL LA) 60 MG 24 hr capsule take 1 capsule by mouth once daily 30 capsule 6   No current facility-administered medications for this visit.     Allergies:   Review of patient's allergies indicates no known allergies.   Social History:  The patient  reports that he has never smoked. He has never used smokeless tobacco. He reports that he does not drink alcohol or use drugs.   Family History:  The patient's family history includes Thrombosis in his  mother.  ROS:  Please see the history of present illness.  All other systems are reviewed and otherwise negative.   PHYSICAL EXAM:  VS:  BP 133/88   Pulse 91   Ht 5\' 11"  (1.803 m)   Wt 201 lb 9.6 oz (91.4 kg)   SpO2 96%   BMI 28.12 kg/m  BMI: Body mass index is 28.12 kg/m. Well nourished, well developed, in no acute distress  HEENT: normocephalic, atraumatic  Neck: no JVD, carotid bruits or masses Cardiac:  RRR; no significant murmurs, no rubs, or gallops Lungs:  clear to auscultation bilaterally, no wheezing, rhonchi or rales  Abd: soft, nontender MS: no deformity or atrophy Ext: no edema, bounding equal radial pulses b/l, no skin changes or swelling noted Skin: warm and dry, no rash Neuro:  No gross deficits appreciated Psych: euthymic mood, full affect  ICD site is stable, no tethering or discomfort   EKG:  Done today shows SR  ICD interrogation today: normal device function, magnet response alert is old, no arrhythmias/events  10/17/15: Echocardiogram Study Conclusions - Left ventricle: The cavity size was normal. Wall thickness was   normal. Systolic function was normal. The estimated ejection   fraction was in the range of 55% to 60%. - Mitral valve: There was mild regurgitation.  MRI 8/12 demonstrated mild impairment in LV systolic function at 49%.  Recent Labs: No results found for requested labs within last  8760 hours.  No results found for requested labs within last 8760 hours.   CrCl cannot be calculated (Patient's most recent lab result is older than the maximum 21 days allowed.).   Wt Readings from Last 3 Encounters:  01/05/16 201 lb 9.6 oz (91.4 kg)  01/12/15 201 lb 6.4 oz (91.4 kg)  10/08/14 208 lb 3.2 oz (94.4 kg)     Other studies reviewed: Additional studies/records reviewed today include: summarized above   ASSESSMENT AND PLAN:  1. Aborted cardiac arrest     Dr. Odessa Fleming notes report genetic screening to have a Lamin mutation       He  would like to explore family testing, and will staff message Margaretmary Dys, Dr. Odessa Fleming RN to reach out to him    device function intact  2. ?HTN     Stable today    Disposition: F/u with 3 month device remote check, Dr. Graciela Husbands in 1 year sooner if needed.  He is urged to get a PMD for routine care as well as further evaluation into his wrist pain.  Current medicines are reviewed at length with the patient today.  The patient did not have any concerns regarding medicines.  Judith Blonder, PA-C 01/05/2016 6:57 PM     [facility] [address] [place] [address] [phone] (office)  [phone] (fax)

## 2016-01-05 NOTE — Patient Instructions (Signed)
Medication Instructions:   Your physician recommends that you continue on your current medications as directed. Please refer to the Current Medication list given to you today.   If you need a refill on your cardiac medications before your next appointment, please call your pharmacy.  Labwork: NONE ORDER TODAY    Testing/Procedures:  NONE ORDER TODAY    Follow-Up:  Your physician wants you to follow-up in: ONE YEAR WITH Graciela Husbands  You will receive a reminder letter in the mail two months in advance. If you don't receive a letter, please call our office to schedule the follow-up appointment.  Remote monitoring is used to monitor your Pacemaker of ICD from home. This monitoring reduces the number of office visits required to check your device to one time per year. It allows [place] to keep an eye on the functioning of your device to ensure it is working properly. You are scheduled for a device check from home on .  04/06/2016.Marland KitchenMarland KitchenYou may send your transmission at any time that day. If you have a wireless device, the transmission will be sent automatically. After your physician reviews your transmission, you will receive a postcard with your next transmission date.     Any Other Special Instructions Will Be Listed Below (If Applicable).

## 2016-01-07 ENCOUNTER — Encounter: Payer: Self-pay | Admitting: Internal Medicine

## 2016-04-06 ENCOUNTER — Encounter: Payer: Self-pay | Admitting: *Deleted

## 2016-04-06 ENCOUNTER — Telehealth: Payer: Self-pay | Admitting: Cardiology

## 2016-04-06 NOTE — Telephone Encounter (Signed)
Spoke with pt and reminded pt of remote transmission that is due today. Pt verbalized understanding.   

## 2016-04-08 ENCOUNTER — Encounter: Payer: Self-pay | Admitting: Cardiology

## 2016-04-26 ENCOUNTER — Telehealth: Payer: Self-pay | Admitting: Cardiology

## 2016-04-26 NOTE — Telephone Encounter (Signed)
Spoke w/ pt and requested that he send a manual transmission b/c his home monitor has not updated in at least 7 days.   

## 2016-06-30 ENCOUNTER — Telehealth: Payer: Self-pay | Admitting: Cardiology

## 2016-06-30 NOTE — Telephone Encounter (Signed)
Spoke w/ pt and requested that he send a manual transmission b/c his home monitor has not updated in at least 7 days.   

## 2016-07-20 ENCOUNTER — Other Ambulatory Visit: Payer: Self-pay | Admitting: Internal Medicine

## 2016-08-18 ENCOUNTER — Encounter: Payer: Self-pay | Admitting: Cardiology

## 2016-09-28 ENCOUNTER — Telehealth: Payer: Self-pay | Admitting: Cardiology

## 2016-09-28 NOTE — Telephone Encounter (Signed)
Spoke w/ pt and requested that he send a manual transmission b/c his home monitor has not updated in at least 7 days.   

## 2016-10-05 ENCOUNTER — Telehealth: Payer: Self-pay | Admitting: Cardiology

## 2016-10-05 NOTE — Telephone Encounter (Signed)
Spoke w/ pt and requested that he send a manual transmission b/c his home monitor has not updated in at least 7 days.   

## 2016-10-13 ENCOUNTER — Encounter: Payer: Self-pay | Admitting: Cardiology

## 2016-11-08 ENCOUNTER — Telehealth: Payer: Self-pay | Admitting: Cardiology

## 2016-11-08 NOTE — Telephone Encounter (Signed)
Spoke w/ pt about his disconnected monitor. Reminded him of the battery recall. He stated that his phone line has been out of service and the phone company is working on it. I informed him that if he feels a vibration he needs to call the office or call 911 or go to ER. Pt verbalized understanding and he agreed to an appt on 01-13-17 at 8:15 AM with MD.

## 2017-01-13 ENCOUNTER — Encounter: Payer: Self-pay | Admitting: Internal Medicine

## 2017-01-25 ENCOUNTER — Encounter: Payer: Self-pay | Admitting: Internal Medicine

## 2017-03-27 ENCOUNTER — Telehealth: Payer: Self-pay | Admitting: Cardiology

## 2017-03-27 NOTE — Telephone Encounter (Signed)
Spoke w/ pt and requested that he send a manual transmission b/c his home monitor has not updated in at least 7 days.   

## 2017-04-26 ENCOUNTER — Telehealth: Payer: Self-pay | Admitting: Cardiology

## 2017-04-26 NOTE — Telephone Encounter (Signed)
Spoke w/ pt and requested that he send a manual transmission b/c his home monitor has not updated in at least 7 days.   

## 2017-08-08 ENCOUNTER — Telehealth: Payer: Self-pay | Admitting: Internal Medicine

## 2017-08-08 NOTE — Telephone Encounter (Signed)
New Message   Patient is calling about numbness he is experiencing in the fingertips of the left arm. He said this has been occurring for about 3 weeks on and off. Please call to discuss.

## 2017-08-08 NOTE — Telephone Encounter (Signed)
Returned pt's call. He has c/o positional knumbness and tingling in his left fingertips. I advised him if he thought they could be stroke symptoms, he should call 911 or go to the hospital. Otherwise, he should follow up with his PCP regarding this. He did state he needed to set up an appointment for a DeFib check that is overdue. I told him I would forward his request to scheduling and to look out for a call back. He had no additional questions.

## 2017-08-09 ENCOUNTER — Encounter: Payer: Self-pay | Admitting: Internal Medicine

## 2017-09-17 ENCOUNTER — Other Ambulatory Visit: Payer: Self-pay | Admitting: Internal Medicine

## 2017-09-18 ENCOUNTER — Other Ambulatory Visit: Payer: Self-pay | Admitting: Internal Medicine

## 2017-09-18 MED ORDER — PROPRANOLOL HCL ER 60 MG PO CP24: 60.0000 mg | ORAL_CAPSULE | Freq: Every day | ORAL | 0 refills | Status: DC

## 2017-09-27 ENCOUNTER — Encounter: Payer: Self-pay | Admitting: Internal Medicine

## 2017-09-27 ENCOUNTER — Ambulatory Visit (INDEPENDENT_AMBULATORY_CARE_PROVIDER_SITE_OTHER): Payer: 59 | Admitting: Internal Medicine

## 2017-09-27 ENCOUNTER — Encounter (INDEPENDENT_AMBULATORY_CARE_PROVIDER_SITE_OTHER): Payer: Self-pay

## 2017-09-27 VITALS — BP 108/72 | HR 77 | Ht 71.0 in | Wt 205.0 lb

## 2017-09-27 DIAGNOSIS — Q998 Other specified chromosome abnormalities: Secondary | ICD-10-CM | POA: Diagnosis not present

## 2017-09-27 DIAGNOSIS — Z95810 Presence of automatic (implantable) cardiac defibrillator: Secondary | ICD-10-CM | POA: Diagnosis not present

## 2017-09-27 DIAGNOSIS — I469 Cardiac arrest, cause unspecified: Secondary | ICD-10-CM | POA: Diagnosis not present

## 2017-09-27 NOTE — Progress Notes (Signed)
Patient Care Team: Patient, No Pcp Per as PCP - General (General Practice)   HPI  [name] is a 42 y.o. male Seen in followup for cardiac arrest and is status post ICD implantation. 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  There have been no palpitations, lightheadedness or syncope.    DATE TEST EF   8/12 cMRI  49 %   5/16 Echo   60-65 %              MRI 8/12 demonstrated mild impairment in LV systolic function at 49%.  Past Medical History:  Diagnosis Date  . Cardiac arrest - ventricular fibrillation    a. s/p STJ ICD  . Sinus tachycardia     Past Surgical History:  Procedure Laterality Date  . CARDIAC CATHETERIZATION    . CARDIAC DEFIBRILLATOR PLACEMENT     St. Jude 1257-40  . cardiac MRI    . DOPPLER ECHOCARDIOGRAPHY      Current Outpatient Medications  Medication Sig Dispense Refill  . propranolol ER (INDERAL LA) 60 MG 24 hr capsule TAKE 1 CAPSULE BY MOUTH EVERY DAY 30 capsule 0   No current facility-administered medications for this visit.     No Known Allergies  Review of Systems negative except from HPI and PMH  Physical Exam BP 108/72   Pulse 77   Ht 5\' 11"  (1.803 m)   Wt 205 lb (93 kg)   SpO2 98%   BMI 28.59 kg/m  Well developed and nourished in no acute distress HENT normal Neck supple with JVP-flat Clear Device pocket well healed; without hematoma or erythema.  There is no tethering  Regular rate and rhythm, no murmurs or gallops Abd-soft with active BS No Clubbing cyanosis edema Skin-warm and dry A & Oriented  Grossly normal sensory and motor function   ECG demonstrates sinus at 77 Intervals 19/10/37 Otherwise normal  Assessment and  Plan  Aborted sudden death No intercurrent Ventricular tachycardia  Famhx of Lamin Cardiomyopathy  ICD  The patient's device was  interrogated.  The information was reviewed. No changes were made in the programming.      Tolerating nadolol.  No interval arrhythmias.

## 2017-09-27 NOTE — Patient Instructions (Addendum)
Medication Instructions:  Your physician recommends that you continue on your current medications as directed. Please refer to the Current Medication list given to you today.  Labwork: None ordered.  Testing/Procedures: None ordered.  Follow-Up: Your physician wants you to follow-up in: One Year with Gypsy BalsamAmber Seiler, NP. You will receive a reminder letter in the mail two months in advance. If you don't receive a letter, please call our office to schedule the follow-up appointment.  Remote monitoring is used to monitor your ICD from home. This monitoring reduces the number of office visits required to check your device to one time per year. It allows us to keep an eye on the functioning of your device to ensure it is working properly. You are scheduled for a device check from home on 12/27/2017. You may send your transmission at any time that day. If you have a wireless device, the transmission will be sent automatically. After your physician reviews your transmission, you will receive a postcard with your next transmission date.   Any Other Special Instructions Will Be Listed Below (If Applicable).     If you need a refill on your cardiac medications before your next appointment, please call your pharmacy.

## 2017-10-03 LAB — CUP PACEART INCLINIC DEVICE CHECK
Date Time Interrogation Session: 20190430080914
Implantable Lead Implant Date: 20120813
Implantable Lead Location: 753860
Implantable Lead Model: 181
Implantable Pulse Generator Implant Date: 20120813
MDC IDC LEAD SERIAL: 308767
Pulse Gen Serial Number: 1017632

## 2017-12-22 ENCOUNTER — Other Ambulatory Visit: Payer: Self-pay | Admitting: Internal Medicine

## 2018-01-02 ENCOUNTER — Telehealth: Payer: Self-pay

## 2018-01-02 ENCOUNTER — Encounter: Payer: 59 | Admitting: *Deleted

## 2018-01-02 NOTE — Telephone Encounter (Signed)
I spoke with the patient about this.

## 2018-01-03 ENCOUNTER — Encounter: Payer: Self-pay | Admitting: Cardiology

## 2018-01-16 ENCOUNTER — Telehealth: Payer: Self-pay | Admitting: Cardiology

## 2018-01-16 NOTE — Telephone Encounter (Signed)
Spoke w/ pt and requested that he send a manual transmission b/c his home monitor has not updated in at least 7 days.   

## 2018-11-26 ENCOUNTER — Ambulatory Visit (INDEPENDENT_AMBULATORY_CARE_PROVIDER_SITE_OTHER): Payer: 59 | Admitting: *Deleted

## 2018-11-26 DIAGNOSIS — I469 Cardiac arrest, cause unspecified: Secondary | ICD-10-CM | POA: Diagnosis not present

## 2018-11-26 LAB — CUP PACEART REMOTE DEVICE CHECK
Battery Remaining Longevity: 38 mo
Battery Remaining Percentage: 35 %
Battery Voltage: 2.87 V
Brady Statistic RV Percent Paced: 1 %
Date Time Interrogation Session: 20200621063515
HighPow Impedance: 68 Ohm
HighPow Impedance: 68 Ohm
Implantable Lead Implant Date: 20120813
Implantable Lead Location: 753860
Implantable Lead Model: 181
Implantable Lead Serial Number: 308767
Implantable Pulse Generator Implant Date: 20120813
Lead Channel Impedance Value: 440 Ohm
Lead Channel Pacing Threshold Amplitude: 1 V
Lead Channel Pacing Threshold Pulse Width: 0.5 ms
Lead Channel Sensing Intrinsic Amplitude: 11.7 mV
Lead Channel Setting Pacing Amplitude: 2.5 V
Lead Channel Setting Pacing Pulse Width: 0.5 ms
Lead Channel Setting Sensing Sensitivity: 0.5 mV
Pulse Gen Serial Number: 1017632

## 2018-12-03 NOTE — Progress Notes (Signed)
Remote ICD transmission.   

## 2019-01-04 ENCOUNTER — Other Ambulatory Visit: Payer: Self-pay | Admitting: Internal Medicine

## 2019-02-26 ENCOUNTER — Ambulatory Visit: Payer: 59 | Admitting: *Deleted

## 2019-03-05 ENCOUNTER — Ambulatory Visit (INDEPENDENT_AMBULATORY_CARE_PROVIDER_SITE_OTHER): Payer: 59 | Admitting: *Deleted

## 2019-03-05 DIAGNOSIS — I469 Cardiac arrest, cause unspecified: Secondary | ICD-10-CM

## 2019-03-05 LAB — CUP PACEART REMOTE DEVICE CHECK
Battery Remaining Longevity: 36 mo
Battery Remaining Percentage: 33 %
Battery Voltage: 2.87 V
Brady Statistic RV Percent Paced: 1 %
Date Time Interrogation Session: 20200929051305
HighPow Impedance: 70 Ohm
HighPow Impedance: 70 Ohm
Implantable Lead Implant Date: 20120813
Implantable Lead Location: 753860
Implantable Lead Model: 181
Implantable Lead Serial Number: 308767
Implantable Pulse Generator Implant Date: 20120813
Lead Channel Impedance Value: 430 Ohm
Lead Channel Pacing Threshold Amplitude: 1 V
Lead Channel Pacing Threshold Pulse Width: 0.5 ms
Lead Channel Sensing Intrinsic Amplitude: 11.7 mV
Lead Channel Setting Pacing Amplitude: 2.5 V
Lead Channel Setting Pacing Pulse Width: 0.5 ms
Lead Channel Setting Sensing Sensitivity: 0.5 mV
Pulse Gen Serial Number: 1017632

## 2019-03-14 NOTE — Progress Notes (Signed)
Remote ICD transmission.   

## 2019-06-04 ENCOUNTER — Ambulatory Visit (INDEPENDENT_AMBULATORY_CARE_PROVIDER_SITE_OTHER): Payer: 59 | Admitting: *Deleted

## 2019-06-04 DIAGNOSIS — I469 Cardiac arrest, cause unspecified: Secondary | ICD-10-CM

## 2019-06-04 LAB — CUP PACEART REMOTE DEVICE CHECK
Battery Remaining Longevity: 34 mo
Battery Remaining Percentage: 31 %
Battery Voltage: 2.86 V
Brady Statistic RV Percent Paced: 1 %
Date Time Interrogation Session: 20201229064415
HighPow Impedance: 70 Ohm
HighPow Impedance: 70 Ohm
Implantable Lead Implant Date: 20120813
Implantable Lead Location: 753860
Implantable Lead Model: 181
Implantable Lead Serial Number: 308767
Implantable Pulse Generator Implant Date: 20120813
Lead Channel Impedance Value: 440 Ohm
Lead Channel Pacing Threshold Amplitude: 1 V
Lead Channel Pacing Threshold Pulse Width: 0.5 ms
Lead Channel Sensing Intrinsic Amplitude: 11.7 mV
Lead Channel Setting Pacing Amplitude: 2.5 V
Lead Channel Setting Pacing Pulse Width: 0.5 ms
Lead Channel Setting Sensing Sensitivity: 0.5 mV
Pulse Gen Serial Number: 1017632

## 2019-07-11 NOTE — Progress Notes (Signed)
[facility] - Clinic Note  07/17/2019     CHIEF COMPLAINT Patient presents for Retina Evaluation   HISTORY OF PRESENT ILLNESS: [name] is a 44 y.o. male who presents to the clinic today for:   HPI    Retina Evaluation    In right eye.  Onset: unknown.  Duration: unknown.  Associated Symptoms Floaters.  Negative for Flashes, Distortion, Pain, Photophobia, Trauma, Jaw Claudication, Fever, Fatigue, Blind Spot, Redness, Glare, Scalp Tenderness, Shoulder/Hip pain and Weight Loss.  Context:  distance vision, mid-range vision and near vision.  Treatments tried include no treatments.  I, the attending physician,  performed the HPI with the patient and updated documentation appropriately.          Comments    Patient saw Alma Downs, PA last week for glasses update. Referred for possible retinal holes OD. Patient has seen floaters for years. No recent worsening of floaters. Patient denies flashes.        Last edited by Rennis Chris, MD on 07/19/2019 11:42 AM. (History)    pt is here on the referral of Alma Downs for possible retinal tear, he states he has known for awhile that he has lattice , but [place] thought he saw some holes in the left eye  Referring physician: Alma Downs, PA-C [address] [place],  [place] [address]  HISTORICAL INFORMATION:   Selected notes from the MEDICAL RECORD NUMBER    CURRENT MEDICATIONS: No current outpatient medications on file. (Ophthalmic Drugs)   No current facility-administered medications for this visit. (Ophthalmic Drugs)   Current Outpatient Medications (Other)  Medication Sig  . propranolol ER (INDERAL LA) 60 MG 24 hr capsule Take 1 capsule (60 mg total) by mouth daily. Please make appt for future refills. [phone]. 1st attempt.   No current facility-administered medications for this visit. (Other)      REVIEW OF SYSTEMS: ROS    Positive for: Cardiovascular, Eyes   Negative for:  Constitutional, Gastrointestinal, Skin, Genitourinary, HENT, Endocrine, Respiratory, Allergic/Imm, Heme/Lymph   Last edited by Annalee Genta D, COT on 07/17/2019  9:23 AM. (History)       ALLERGIES No Known Allergies  PAST MEDICAL HISTORY Past Medical History:  Diagnosis Date  . Cardiac arrest - ventricular fibrillation    a. s/p STJ ICD  . Sinus tachycardia    Past Surgical History:  Procedure Laterality Date  . CARDIAC CATHETERIZATION    . CARDIAC DEFIBRILLATOR PLACEMENT     St. Jude 1257-40  . cardiac MRI    . DOPPLER ECHOCARDIOGRAPHY      FAMILY HISTORY Family History  Problem Relation Age of Onset  . Thrombosis Mother        blood clot in leg    SOCIAL HISTORY Social History   Tobacco Use  . Smoking status: Never Smoker  . Smokeless tobacco: Never Used  Substance Use Topics  . Alcohol use: No  . Drug use: No         OPHTHALMIC EXAM:  Base Eye Exam    Visual Acuity (Snellen - Linear)      Right Left   Dist cc 20/20 20/20   Correction: Glasses       Tonometry (Tonopen, 9:29 AM)      Right Left   Pressure 22 18       Pupils      Dark Light Shape React APD   Right 4 3 Round Brisk None   Left 4  3 Round Brisk None       Visual Fields (Counting fingers)      Left Right    Full Full       Extraocular Movement      Right Left    Full, Ortho Full, Ortho       Neuro/Psych    Oriented x3: Yes   Mood/Affect: Normal       Dilation    Both eyes: 1.0% Mydriacyl, 2.5% Phenylephrine @ 9:29 AM        Slit Lamp and Fundus Exam    Slit Lamp Exam      Right Left   Lids/Lashes Normal Normal   Conjunctiva/Sclera Pinguecula, Melanosis Pinguecula, Melanosis   Cornea Trace Punctate epithelial erosions Trace Punctate epithelial erosions   Anterior Chamber Deep and quiet Deep and quiet   Iris Round and dilated Round and dilated   Lens Clear Clear   Vitreous Vitreous syneresis Vitreous syneresis       Fundus Exam      Right Left   Disc Pink  and Sharp Pink and Sharp   C/D Ratio 0.3 0.3   Macula Flat, Good foveal reflex, Retinal pigment epithelial mottling, No heme or edema Flat, Good foveal reflex, Retinal pigment epithelial mottling, No heme or edema   Vessels Normal Mild Vascular attenuation, mild Tortuousity   Periphery Attached, pigmented lattice with atrophic hole at 0600, patch at 0730 with round hole, no SRF Attached, focal patch of pigmented lattice at 0430 (small), pigmented lattice with atrophic holes from 0500-0600, pigmented CR scar for 0700 equator        Refraction    Manifest Refraction      Sphere Cylinder Axis   Right -2.50 +1.25 080   Left -2.25 +0.50 078          IMAGING AND PROCEDURES  Imaging and Procedures for @TODAY @  OCT, Retina - OU - Both Eyes       Right Eye Quality was good. Central Foveal Thickness: 257. Progression has no prior data. Findings include normal foveal contour, no IRF, no SRF, vitreomacular adhesion .   Left Eye Quality was good. Central Foveal Thickness: 265. Progression has no prior data. Findings include normal foveal contour, no IRF, no SRF, vitreomacular adhesion .   Notes *Images captured and stored on drive  Diagnosis / Impression:  NFP, no IRF/SRF OU VMA OU  Clinical management:  See below  Abbreviations: NFP - Normal foveal profile. CME - cystoid macular edema. PED - pigment epithelial detachment. IRF - intraretinal fluid. SRF - subretinal fluid. EZ - ellipsoid zone. ERM - epiretinal membrane. ORA - outer retinal atrophy. ORT - outer retinal tubulation. SRHM - subretinal hyper-reflective material                 ASSESSMENT/PLAN:    ICD-10-CM   1. Bilateral retinal lattice degeneration  H35.413   2. 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 Retinal edema  H35.81 OCT, Retina - OU - Both Eyes    1,2. Lattice degeneration w/ atrophic holes, both eyes  - OD: pigmented lattice degeneration with atrophic hole at 0600, patch at 0730 with round hole  -  OS: focal patch of pigmented lattice at 0430, pigmented lattice with atrophic holes from 0500-0600  - discussed findings, prognosis, and treatment options including observation  - recommend laser retinopexy OD today, 02.10.21  - pt wishes to have laser procedure on Monday  - f/u Monday, February 15 at 945,  DFE, OCT, laser retinopexy OD  3. No retinal edema on exam or OCT   Ophthalmic Meds Ordered this visit:  No orders of the defined types were placed in this encounter.      Return in about 5 days (around 07/22/2019) for Laser retinopexy OD.  There are no Patient Instructions on file for this visit.   Explained the diagnoses, plan, and follow up with the patient and they expressed understanding.  Patient expressed understanding of the importance of proper follow up care.   This document serves as a record of services personally performed by Karie Chimera, MD, PhD. It was created on their behalf by Annalee Genta, COMT. The creation of this record is the provider's dictation and/or activities during the visit.  Electronically signed by: Annalee Genta, COMT 07/19/19 11:48 AM   This document serves as a record of services personally performed by Karie Chimera, MD, PhD. It was created on their behalf by Laurian Brim, OA, an ophthalmic assistant. The creation of this record is the provider's dictation and/or activities during the visit.    Electronically signed by: Laurian Brim, OA 02.10.2021 11:48 AM   Karie Chimera, M.D., Ph.D. Diseases & Surgery of the Retina and Vitreous [facility]  I have reviewed the above documentation for accuracy and completeness, and I agree with the above. Karie Chimera, M.D., Ph.D. 07/19/19 11:50 AM   Abbreviations: M myopia (nearsighted); A astigmatism; H hyperopia (farsighted); P presbyopia; Mrx spectacle prescription;  CTL contact lenses; OD right eye; OS left eye; OU both eyes  XT exotropia; ET esotropia; PEK punctate  epithelial keratitis; PEE punctate epithelial erosions; DES dry eye syndrome; MGD meibomian gland dysfunction; ATs artificial tears; PFAT's preservative free artificial tears; NSC nuclear sclerotic cataract; PSC posterior subcapsular cataract; ERM epi-retinal membrane; PVD posterior vitreous detachment; RD retinal detachment; DM diabetes mellitus; DR diabetic retinopathy; NPDR non-proliferative diabetic retinopathy; PDR proliferative diabetic retinopathy; CSME clinically significant macular edema; DME diabetic macular edema; dbh dot blot hemorrhages; CWS cotton wool spot; POAG primary open angle glaucoma; C/D cup-to-disc ratio; HVF humphrey visual field; GVF goldmann visual field; OCT optical coherence tomography; IOP intraocular pressure; BRVO Branch retinal vein occlusion; CRVO central retinal vein occlusion; CRAO central retinal artery occlusion; BRAO branch retinal artery occlusion; RT retinal tear; SB scleral buckle; PPV pars plana vitrectomy; VH Vitreous hemorrhage; PRP panretinal laser photocoagulation; IVK intravitreal kenalog; VMT vitreomacular traction; MH Macular hole;  NVD neovascularization of the disc; NVE neovascularization elsewhere; AREDS age related eye disease study; ARMD age related macular degeneration; POAG primary open angle glaucoma; EBMD epithelial/anterior basement membrane dystrophy; ACIOL anterior chamber intraocular lens; IOL intraocular lens; PCIOL posterior chamber intraocular lens; Phaco/IOL phacoemulsification with intraocular lens placement; PRK photorefractive keratectomy; LASIK laser assisted in situ keratomileusis; HTN hypertension; DM diabetes mellitus; COPD chronic obstructive pulmonary disease

## 2019-07-17 ENCOUNTER — Encounter (INDEPENDENT_AMBULATORY_CARE_PROVIDER_SITE_OTHER): Payer: Self-pay | Admitting: Ophthalmology

## 2019-07-17 ENCOUNTER — Ambulatory Visit (INDEPENDENT_AMBULATORY_CARE_PROVIDER_SITE_OTHER): Payer: 59 | Admitting: Ophthalmology

## 2019-07-17 ENCOUNTER — Other Ambulatory Visit: Payer: Self-pay

## 2019-07-17 DIAGNOSIS — H35413 Lattice degeneration of retina, bilateral: Secondary | ICD-10-CM | POA: Diagnosis not present

## 2019-07-17 DIAGNOSIS — H33323 Round hole, bilateral: Secondary | ICD-10-CM

## 2019-07-17 DIAGNOSIS — H3581 Retinal edema: Secondary | ICD-10-CM | POA: Diagnosis not present

## 2019-07-19 ENCOUNTER — Encounter (INDEPENDENT_AMBULATORY_CARE_PROVIDER_SITE_OTHER): Payer: Self-pay | Admitting: Ophthalmology

## 2019-07-19 NOTE — Progress Notes (Addendum)
[facility] Clinic Note  07/22/2019     CHIEF COMPLAINT Patient presents for Retina Follow Up   HISTORY OF PRESENT ILLNESS: [name] is a 44 y.o. male who presents to the clinic today for:   HPI    Retina Follow Up    Patient presents with  Retinal Break/Detachment.  In right eye.  Severity is mild.  Duration of 5 days.  Since onset it is stable.  I, the attending physician,  performed the HPI with the patient and updated documentation appropriately.          Comments    Pt states his vision has been the same since his last visit, he states his eyes have felt "off" or "not normal" since he was here last       Last edited by Bernarda Caffey, MD on 07/22/2019  9:48 AM. (History)    pt here for laser retinopexy OD today   Referring physician: Shirleen Schirmer, PA-C [address] [place],  [place] [address]  HISTORICAL INFORMATION:   Selected notes from the [place]: Current Outpatient Medications (Ophthalmic Drugs)  Medication Sig  . prednisoLONE acetate (PRED FORTE) 1 % ophthalmic suspension Place 1 drop into the right eye 4 (four) times daily.   No current facility-administered medications for this visit. (Ophthalmic Drugs)   Current Outpatient Medications (Other)  Medication Sig  . propranolol ER (INDERAL LA) 60 MG 24 hr capsule Take 1 capsule (60 mg total) by mouth daily. Please make appt for future refills. [phone]. 1st attempt.   No current facility-administered medications for this visit. (Other)      REVIEW OF SYSTEMS: ROS    Positive for: Eyes   Negative for: Constitutional, Gastrointestinal, Neurological, Skin, Genitourinary, Musculoskeletal, HENT, Endocrine, Cardiovascular, Respiratory, Psychiatric, Allergic/Imm, Heme/Lymph   Last edited by Debbrah Alar, COT on 07/22/2019  8:38 AM. (History)       ALLERGIES No Known Allergies  PAST MEDICAL HISTORY Past Medical History:   Diagnosis Date  . Cardiac arrest - ventricular fibrillation    a. s/p STJ ICD  . Sinus tachycardia    Past Surgical History:  Procedure Laterality Date  . CARDIAC CATHETERIZATION    . CARDIAC DEFIBRILLATOR PLACEMENT     St. Jude 1257-40  . cardiac MRI    . DOPPLER ECHOCARDIOGRAPHY      FAMILY HISTORY Family History  Problem Relation Age of Onset  . Thrombosis Mother        blood clot in leg    SOCIAL HISTORY Social History   Tobacco Use  . Smoking status: Never Smoker  . Smokeless tobacco: Never Used  Substance Use Topics  . Alcohol use: No  . Drug use: No         OPHTHALMIC EXAM:  Base Eye Exam    Visual Acuity (Snellen - Linear)      Right Left   Dist cc 20/20 20/20   Correction: Glasses       Tonometry (Tonopen, 8:44 AM)      Right Left   Pressure 19 18       Pupils      Dark Light Shape React APD   Right 4 3 Round Brisk None   Left 4 3 Round Brisk None       Visual Fields (Counting fingers)      Left Right     Full  Extraocular Movement      Right Left    Full, Ortho Full, Ortho       Neuro/Psych    Oriented x3: Yes   Mood/Affect: Normal       Dilation    Both eyes: 1.0% Mydriacyl, 2.5% Phenylephrine @ 8:45 AM        Slit Lamp and Fundus Exam    Slit Lamp Exam      Right Left   Lids/Lashes Normal Normal   Conjunctiva/Sclera Pinguecula, Melanosis Pinguecula, Melanosis   Cornea Trace Punctate epithelial erosions Trace Punctate epithelial erosions   Anterior Chamber Deep and quiet Deep and quiet   Iris Round and dilated Round and dilated   Lens Clear Clear   Vitreous Vitreous syneresis Vitreous syneresis       Fundus Exam      Right Left   Disc Pink and Sharp Pink and Sharp   C/D Ratio 0.3 0.3   Macula Flat, Good foveal reflex, Retinal pigment epithelial mottling, No heme or edema Flat, Good foveal reflex, Retinal pigment epithelial mottling, No heme or edema   Vessels Normal Mild Vascular attenuation, mild  Tortuousity   Periphery Attached, pigmented lattice with atrophic holes from 0530- 0730, no SRF Attached, focal patch of pigmented lattice at 0430 (small), pigmented lattice with atrophic holes from 0500-0600, pigmented CR scar for 0700 equator          IMAGING AND PROCEDURES  Imaging and Procedures for @TODAY @  OCT, Retina - OU - Both Eyes       Right Eye Quality was good. Central Foveal Thickness: 257. Progression has been stable. Findings include normal foveal contour, no IRF, no SRF, vitreomacular adhesion .   Left Eye Quality was good. Central Foveal Thickness: 265. Progression has been stable. Findings include normal foveal contour, no IRF, no SRF, vitreomacular adhesion .   Notes *Images captured and stored on drive  Diagnosis / Impression:  NFP, no IRF/SRF OU VMA OU  Clinical management:  See below  Abbreviations: NFP - Normal foveal profile. CME - cystoid macular edema. PED - pigment epithelial detachment. IRF - intraretinal fluid. SRF - subretinal fluid. EZ - ellipsoid zone. ERM - epiretinal membrane. ORA - outer retinal atrophy. ORT - outer retinal tubulation. SRHM - subretinal hyper-reflective material        Repair Retinal Breaks, Laser - OD - Right Eye       LASER PROCEDURE NOTE  Procedure:  Barrier laser retinopexy using slit lamp laser, RIGHT eye   Diagnosis:   Lattice degeneration w/ atrophic holes, RIGHT eye                     Patches of lattice: inferiorly  Surgeon: [id number], MD,PhD  Anesthesia: Topical  Informed consent obtained, operative eye marked, and time out performed prior to initiation of laser.   Laser settings:  Lumenis [id number] laser, slit lamp Lens: [place] PRP 165 Power: 260 mW Spot size: 200 microns Duration: 30 msec  # spots: 446  Placement of laser: Using a [place]ster PRP 165 contact lens at the slit lamp, laser was placed in three confluent rows around inferior patches of lattice w/ atrophic holes from  0530-0730 oclock anterior to equator.  Complications: None.  Patient tolerated the procedure well and received written and verbal post-procedure care information/education.                ASSESSMENT/PLAN:    ICD-10-CM   1. Bilateral retinal lattice degeneration  H35.413 Repair Retinal Breaks, Laser - OD - Right Eye  2. Retinal hole of both eyes  H33.323 Repair Retinal Breaks, Laser - OD - Right Eye  3. Retinal edema  H35.81 OCT, Retina - OU - Both Eyes    1,2. Lattice degeneration w/ atrophic holes, both eyes  - OD: pigmented lattice with atrophic holes from 0530- 0730, no SRF  - OS: focal patch of pigmented lattice at 0430, pigmented lattice with atrophic holes from 0500-0600  - discussed findings, prognosis, and treatment options including observation  - recommend laser retinopexy OD today, 02.15.21  - pt wishes to have laser procedure  - RBA of procedure discussed, questions answered  - informed consent obtained and signed  - see procedure note  - start PF qid OD x7 days  - f/u 1 week, POV OD, laser retinopexy OS  3. No retinal edema on exam or OCT   Ophthalmic Meds Ordered this visit:  Meds ordered this encounter  Medications  . prednisoLONE acetate (PRED FORTE) 1 % ophthalmic suspension    Sig: Place 1 drop into the right eye 4 (four) times daily.    Dispense:  10 mL    Refill:  0       Return in about 1 week (around 07/29/2019) for f/u lattice degeneration OU, laser OS.  There are no Patient Instructions on file for this visit.   Explained the diagnoses, plan, and follow up with the patient and they expressed understanding.  Patient expressed understanding of the importance of proper follow up care.   This document serves as a record of services personally performed by Karie Chimera, MD, PhD. It was created on their behalf by Herby Abraham, COA, a certified ophthalmic assistant. The creation of this record is the provider's dictation and/or activities  during the visit.    Electronically signed by: Herby Abraham, COA @TODAY @ 12:55 PM   This document serves as a record of services personally performed by , MD, PhD. It was created on their behalf by Karie Chimera, OA, an ophthalmic assistant. The creation of this record is the provider's dictation and/or activities during the visit.    Electronically signed by: Laurian Brim, OA 02.15.2021 12:55 PM   02.17.2021, M.D., Ph.D. Diseases & Surgery of the Retina and Vitreous [facility]  I have reviewed the above documentation for accuracy and completeness, and I agree with the above. [facility], M.D., Ph.D. 07/22/19 12:55 PM     Abbreviations: M myopia (nearsighted); A astigmatism; H hyperopia (farsighted); P presbyopia; Mrx spectacle prescription;  CTL contact lenses; OD right eye; OS left eye; OU both eyes  XT exotropia; ET esotropia; PEK punctate epithelial keratitis; PEE punctate epithelial erosions; DES dry eye syndrome; MGD meibomian gland dysfunction; ATs artificial tears; PFAT's preservative free artificial tears; NSC nuclear sclerotic cataract; PSC posterior subcapsular cataract; ERM epi-retinal membrane; PVD posterior vitreous detachment; RD retinal detachment; DM diabetes mellitus; DR diabetic retinopathy; NPDR non-proliferative diabetic retinopathy; PDR proliferative diabetic retinopathy; CSME clinically significant macular edema; DME diabetic macular edema; dbh dot blot hemorrhages; CWS cotton wool spot; POAG primary open angle glaucoma; C/D cup-to-disc ratio; HVF humphrey visual field; GVF goldmann visual field; OCT optical coherence tomography; IOP intraocular pressure; BRVO Branch retinal vein occlusion; CRVO central retinal vein occlusion; CRAO central retinal artery occlusion; BRAO branch retinal artery occlusion; RT retinal tear; SB scleral buckle; PPV pars plana vitrectomy; VH Vitreous hemorrhage; PRP panretinal laser photocoagulation;  IVK intravitreal  kenalog; VMT vitreomacular traction; MH Macular hole;  NVD neovascularization of the disc; NVE neovascularization elsewhere; AREDS age related eye disease study; ARMD age related macular degeneration; POAG primary open angle glaucoma; EBMD epithelial/anterior basement membrane dystrophy; ACIOL anterior chamber intraocular lens; IOL intraocular lens; PCIOL posterior chamber intraocular lens; Phaco/IOL phacoemulsification with intraocular lens placement; [place] photorefractive keratectomy; LASIK laser assisted in situ keratomileusis; HTN hypertension; DM diabetes mellitus; COPD chronic obstructive pulmonary disease

## 2019-07-22 ENCOUNTER — Ambulatory Visit (INDEPENDENT_AMBULATORY_CARE_PROVIDER_SITE_OTHER): Payer: 59 | Admitting: Ophthalmology

## 2019-07-22 ENCOUNTER — Encounter (INDEPENDENT_AMBULATORY_CARE_PROVIDER_SITE_OTHER): Payer: Self-pay | Admitting: Ophthalmology

## 2019-07-22 ENCOUNTER — Other Ambulatory Visit: Payer: Self-pay

## 2019-07-22 DIAGNOSIS — H35413 Lattice degeneration of retina, bilateral: Secondary | ICD-10-CM

## 2019-07-22 DIAGNOSIS — H33323 Round hole, bilateral: Secondary | ICD-10-CM | POA: Diagnosis not present

## 2019-07-22 DIAGNOSIS — H3581 Retinal edema: Secondary | ICD-10-CM | POA: Diagnosis not present

## 2019-07-22 MED ORDER — PREDNISOLONE ACETATE 1 % OP SUSP: 1.0000 [drp] | Freq: Four times a day (QID) | OPHTHALMIC | 0 refills | Status: DC

## 2019-07-26 NOTE — Progress Notes (Signed)
[facility] Note  07/29/2019     CHIEF COMPLAINT Patient presents for Retina Follow Up   HISTORY OF PRESENT ILLNESS: [name] is a 44 y.o. male who presents to the clinic today for:   HPI    Retina Follow Up    Patient presents with  Retinal Break/Detachment (Lattice with holes).  In both eyes.  Severity is moderate.  Duration of 1 week.  Since onset it is stable.  I, the attending physician,  performed the HPI with the patient and updated documentation appropriately.          Comments    Patient states vision the same OU. No new floaters/flashes. Still using PF qid OD per Dr. Coralyn Pear.        Last edited by Bernarda Caffey, MD on 07/29/2019 11:46 AM. (History)    pt here for laser retinopexy OS today   Referring physician: Shirleen Schirmer, PA-C [address] [place],  [place] [address]  HISTORICAL INFORMATION:   Selected notes from the MEDICAL RECORD NUMBER    CURRENT MEDICATIONS: Current Outpatient Medications (Ophthalmic Drugs)  Medication Sig  . prednisoLONE acetate (PRED FORTE) 1 % ophthalmic suspension Place 1 drop into the right eye 4 (four) times daily.   No current facility-administered medications for this visit. (Ophthalmic Drugs)   Current Outpatient Medications (Other)  Medication Sig  . propranolol ER (INDERAL LA) 60 MG 24 hr capsule Take 1 capsule (60 mg total) by mouth daily. Please make appt for future refills. [id number]. 1st attempt.   No current facility-administered medications for this visit. (Other)      REVIEW OF SYSTEMS: ROS    Positive for: Cardiovascular, Eyes   Negative for: Constitutional, Gastrointestinal, Neurological, Skin, Genitourinary, Musculoskeletal, HENT, Endocrine, Respiratory, Psychiatric, Allergic/Imm, Heme/Lymph   Last edited by Bernarda Caffey, MD on 07/29/2019 11:46 AM. (History)       ALLERGIES No Known Allergies  PAST MEDICAL HISTORY Past Medical History:  Diagnosis Date   . Cardiac arrest - ventricular fibrillation    a. s/p STJ ICD  . Sinus tachycardia    Past Surgical History:  Procedure Laterality Date  . CARDIAC CATHETERIZATION    . CARDIAC DEFIBRILLATOR PLACEMENT     St. Jude 1257-40  . cardiac MRI    . DOPPLER ECHOCARDIOGRAPHY      FAMILY HISTORY Family History  Problem Relation Age of Onset  . Thrombosis Mother        blood clot in leg    SOCIAL HISTORY Social History   Tobacco Use  . Smoking status: Never Smoker  . Smokeless tobacco: Never Used  Substance Use Topics  . Alcohol use: No  . Drug use: No         OPHTHALMIC EXAM:  Base Eye Exam    Visual Acuity (Snellen - Linear)      Right Left   Dist cc 20/20 20/20   Correction: Glasses       Tonometry (Tonopen, 9:41 AM)      Right Left   Pressure 20 15       Pupils      Dark Light Shape React APD   Right 4 3 Round Brisk None   Left 4 3 Round Brisk None       Visual Fields (Counting fingers)      Left Right    Full Full       Extraocular Movement      Right Left  Full, Ortho Full, Ortho       Neuro/Psych    Oriented x3: Yes   Mood/Affect: Normal       Dilation    Both eyes: 1.0% Mydriacyl, 2.5% Phenylephrine @ 9:41 AM        Slit Lamp and Fundus Exam    Slit Lamp Exam      Right Left   Lids/Lashes Normal Normal   Conjunctiva/Sclera Pinguecula, Melanosis Pinguecula, Melanosis   Cornea Trace Punctate epithelial erosions Trace Punctate epithelial erosions   Anterior Chamber Deep and quiet Deep and quiet   Iris Round and dilated Round and dilated   Lens Clear Clear   Vitreous Vitreous syneresis Vitreous syneresis       Fundus Exam      Right Left   Disc Pink and Sharp Pink and Sharp   C/D Ratio 0.3 0.3   Macula Flat, Good foveal reflex, Retinal pigment epithelial mottling, No heme or edema Flat, Good foveal reflex, Retinal pigment epithelial mottling, No heme or edema   Vessels Normal Mild Vascular attenuation, mild Tortuousity    Periphery Attached, pigmented lattice with atrophic holes from 0530- 0730, no SRF Attached, focal patches of pigmented lattice at 0300 and 0430 (small), pigmented lattice with atrophic holes from 0500-0600          IMAGING AND PROCEDURES  Imaging and Procedures for @TODAY @  Repair Retinal Breaks, Laser - OS - Left Eye       LASER PROCEDURE NOTE  Procedure:  Barrier laser retinopexy using slit lamp laser, LEFT eye   Diagnosis:   Lattice degeneration w/ atrophic holes, LEFT eye                     Patches of lattice: 3-6 oclock  Surgeon: , MD, PhD  Anesthesia: Topical  Informed consent obtained, operative eye marked, and time out performed prior to initiation of laser.   Laser settings:  Lumenis Smart532 laser, slit lamp Lens: Mainster PRP 165 Power: 250 mW Spot size: 200 microns Duration: 30 msec  # spots: 652  Placement of laser: Using a Mainster PRP 165 contact lens at the slit lamp, laser was placed in three confluent rows around patches of lattice w/ atrophic holes from 3-6 oclock anterior to equator with additional rows anteriorly.  Complications: None.  Patient tolerated the procedure well and received written and verbal post-procedure care information/education.                 ASSESSMENT/PLAN:    ICD-10-CM   1. Bilateral retinal lattice degeneration  H35.413 Repair Retinal Breaks, Laser - OS - Left Eye  2. Retinal hole of both eyes  H33.323 Repair Retinal Breaks, Laser - OS - Left Eye  3. Retinal edema  H35.81     1,2. Lattice degeneration w/ atrophic holes, both eyes  - OD: pigmented lattice with atrophic holes from 0530- 0730, no SRF  - OS: focal patches of pigmented lattice w/ atrophic holes from 0300-0600, no SRF  - discussed findings, prognosis, and treatment options including observation  - s/p laser retinopexy OD 02.15.21  - recommend laser retinopexy OS today, 2.22.21  - pt wishes to proceed with laser procedure OS  - RBA of  procedure discussed, questions answered  - informed consent obtained and signed  - see procedure note  - start PF qid OS x7 days  - f/u 3-4 week, POV OU  3. No retinal edema on exam or OCT   Ophthalmic Meds  Ordered this visit:  No orders of the defined types were placed in this encounter.      Return for 3-4 wks -- POV Laser OU.  There are no Patient Instructions on file for this visit.   Explained the diagnoses, plan, and follow up with the patient and they expressed understanding.  Patient expressed understanding of the importance of proper follow up care.   This document serves as a record of services personally performed by Karie Chimera, MD, PhD. It was created on their behalf by Herby Abraham, COA, a certified ophthalmic assistant. The creation of this record is the provider's dictation and/or activities during the visit.    Electronically signed by: Herby Abraham, COA @TODAY @ 12:52 PM   , M.D., Ph.D. Diseases & Surgery of the Retina and Vitreous [facility]  I have reviewed the above documentation for accuracy and completeness, and I agree with the above. [facility], M.D., Ph.D. 07/29/19 12:52 PM    Abbreviations: M myopia (nearsighted); A astigmatism; H hyperopia (farsighted); P presbyopia; Mrx spectacle prescription;  CTL contact lenses; OD right eye; OS left eye; OU both eyes  XT exotropia; ET esotropia; PEK punctate epithelial keratitis; PEE punctate epithelial erosions; DES dry eye syndrome; MGD meibomian gland dysfunction; ATs artificial tears; PFAT's preservative free artificial tears; NSC nuclear sclerotic cataract; PSC posterior subcapsular cataract; ERM epi-retinal membrane; PVD posterior vitreous detachment; RD retinal detachment; DM diabetes mellitus; DR diabetic retinopathy; NPDR non-proliferative diabetic retinopathy; PDR proliferative diabetic retinopathy; CSME clinically significant macular edema; DME diabetic  macular edema; dbh dot blot hemorrhages; CWS cotton wool spot; POAG primary open angle glaucoma; C/D cup-to-disc ratio; HVF humphrey visual field; GVF goldmann visual field; OCT optical coherence tomography; IOP intraocular pressure; BRVO Branch retinal vein occlusion; CRVO central retinal vein occlusion; CRAO central retinal artery occlusion; BRAO branch retinal artery occlusion; RT retinal tear; SB scleral buckle; PPV pars plana vitrectomy; VH Vitreous hemorrhage; PRP panretinal laser photocoagulation; IVK intravitreal kenalog; VMT vitreomacular traction; MH Macular hole;  NVD neovascularization of the disc; NVE neovascularization elsewhere; AREDS age related eye disease study; ARMD age related macular degeneration; POAG primary open angle glaucoma; EBMD epithelial/anterior basement membrane dystrophy; ACIOL anterior chamber intraocular lens; IOL intraocular lens; PCIOL posterior chamber intraocular lens; Phaco/IOL phacoemulsification with intraocular lens placement; PRK photorefractive keratectomy; LASIK laser assisted in situ keratomileusis; HTN hypertension; DM diabetes mellitus; COPD chronic obstructive pulmonary disease

## 2019-07-29 ENCOUNTER — Encounter (INDEPENDENT_AMBULATORY_CARE_PROVIDER_SITE_OTHER): Payer: Self-pay | Admitting: Ophthalmology

## 2019-07-29 ENCOUNTER — Ambulatory Visit (INDEPENDENT_AMBULATORY_CARE_PROVIDER_SITE_OTHER): Payer: 59 | Admitting: Ophthalmology

## 2019-07-29 DIAGNOSIS — H33323 Round hole, bilateral: Secondary | ICD-10-CM | POA: Diagnosis not present

## 2019-07-29 DIAGNOSIS — H3581 Retinal edema: Secondary | ICD-10-CM

## 2019-07-29 DIAGNOSIS — H35413 Lattice degeneration of retina, bilateral: Secondary | ICD-10-CM | POA: Diagnosis not present

## 2019-08-26 ENCOUNTER — Encounter (INDEPENDENT_AMBULATORY_CARE_PROVIDER_SITE_OTHER): Payer: 59 | Admitting: Ophthalmology

## 2019-09-03 ENCOUNTER — Ambulatory Visit (INDEPENDENT_AMBULATORY_CARE_PROVIDER_SITE_OTHER): Payer: 59 | Admitting: *Deleted

## 2019-09-03 DIAGNOSIS — I469 Cardiac arrest, cause unspecified: Secondary | ICD-10-CM | POA: Diagnosis not present

## 2019-09-05 LAB — CUP PACEART REMOTE DEVICE CHECK
Battery Remaining Longevity: 32 mo
Battery Remaining Percentage: 29 %
Battery Voltage: 2.84 V
Brady Statistic RV Percent Paced: 1 %
Date Time Interrogation Session: 20210401021410
HighPow Impedance: 69 Ohm
HighPow Impedance: 69 Ohm
Implantable Lead Implant Date: 20120813
Implantable Lead Location: 753860
Implantable Lead Model: 181
Implantable Lead Serial Number: 308767
Implantable Pulse Generator Implant Date: 20120813
Lead Channel Impedance Value: 430 Ohm
Lead Channel Pacing Threshold Amplitude: 1 V
Lead Channel Pacing Threshold Pulse Width: 0.5 ms
Lead Channel Sensing Intrinsic Amplitude: 11.7 mV
Lead Channel Setting Pacing Amplitude: 2.5 V
Lead Channel Setting Pacing Pulse Width: 0.5 ms
Lead Channel Setting Sensing Sensitivity: 0.5 mV
Pulse Gen Serial Number: 1017632

## 2019-09-25 ENCOUNTER — Telehealth: Payer: Self-pay | Admitting: Internal Medicine

## 2019-09-25 NOTE — Telephone Encounter (Signed)
   Pt c/o of Chest Pain: STAT if CP now or developed within 24 hours  1. Are you having CP right now? No  2. Are you experiencing any other symptoms (ex. SOB, nausea, vomiting, sweating)? Denied other systoms  3. How long have you been experiencing CP? A week  4. Is your CP continuous or coming and going? Coming and going  5. Have you taken Nitroglycerin? No ? Pt's wife calling, he said last night pt had sharp pain and when she asked him he said this been going on for a week, pt denies any other symptoms. She said we can call Pt back on his mobile [phone]

## 2019-09-25 NOTE — Telephone Encounter (Signed)
Spoke with pt who states he has had intermittent sharp pain in lower part of his chest x 1 week.  Pt denies other symptoms such as SOB, edema, weakness or fainting.  Pt has not been seen in office since 2019.  Pt states he does not feel like pain is heart related but would like to be seen in the office since it has been awhile.  Pt scheduled to see Otilio Saber, PA 10/01/2019 at 825am.  Pt advised if he develops any of the symptoms listed above or experiences CP again to go to ED for further evaluation.  Pt verbalizes understanding and agrees with current plan.

## 2019-09-30 NOTE — Progress Notes (Signed)
Electrophysiology Office Note Date: 10/01/2019  ID:  WILLLIAM PETTET, DOB 1975-10-25, MRN [id number]  PCP: Duke Salvia, MD Primary Cardiologist: No primary care provider on file. Electrophysiologist: Dr. Graciela Husbands  CC: Routine ICD follow-up  ANTERIO SCHEEL is a 44 y.o. male seen today for None for routine electrophysiology followup.  Since last being seen in our clinic the patient reports doing well. He has a chronic, atypical chest pain that he describes as a point tenderness. Located on the left side of his sternum, 4-5 intercostal space. Mostly bothers him at night, but only a few times a year. Denies exertional chest pain. He denies palpitations, dyspnea, PND, orthopnea, nausea, vomiting, dizziness, syncope, edema, weight gain, or early satiety. He has not had ICD shocks.   Device History: St. 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 Cardiac arrest - ventricular fibrillation    a. s/p STJ ICD  . Sinus tachycardia    Past Surgical History:  Procedure Laterality Date  . CARDIAC CATHETERIZATION    . CARDIAC DEFIBRILLATOR PLACEMENT     St. Jude 1257-40  . cardiac MRI    . DOPPLER ECHOCARDIOGRAPHY      Current Outpatient Medications  Medication Sig Dispense Refill  . propranolol ER (INDERAL LA) 60 MG 24 hr capsule Take 1 capsule (60 mg total) by mouth daily. Please make appt for future refills. [id number]. 1st attempt. 30 capsule 0   No current facility-administered medications for this visit.    Allergies:   Patient has no known allergies.   Social History: Social History   Socioeconomic History  . Marital status: Married    Spouse name: Not on file  . Number of children: Not on file  . Years of education: Not on file  . Highest education level: Not on file  Occupational History  . Not on file  Tobacco Use  . Smoking status:  Never Smoker  . Smokeless tobacco: Never Used  Substance and Sexual Activity  . Alcohol use: No  . Drug use: No  . Sexual activity: Yes  Other Topics Concern  . Not on file  Social History Narrative  . Not on file   Social Determinants of Health   Financial Resource Strain:   . Difficulty of Paying Living Expenses:   Food Insecurity:   . Worried About Programme researcher, broadcasting/film/video in the Last Year:   . Barista in the Last Year:   Transportation Needs:   . Freight forwarder (Medical):   Marland Kitchen Lack of Transportation (Non-Medical):   Physical Activity:   . Days of Exercise per Week:   . Minutes of Exercise per Session:   Stress:   . Feeling of Stress :   Social Connections:   . Frequency of Communication with Friends and Family:   . Frequency of Social Gatherings with Friends and Family:   . Attends Religious Services:   . Active Member of Clubs or Organizations:   . Attends Banker Meetings:   Marland Kitchen Marital Status:   Intimate Partner Violence:   . Fear of Current or Ex-Partner:   . Emotionally Abused:   Marland Kitchen Physically Abused:   . Sexually Abused:     Family History: Family History  Problem Relation Age of Onset  . Thrombosis Mother        blood clot in leg  Review of Systems: All other systems reviewed and are otherwise negative except as noted above.   Physical Exam: Vitals:   10/01/19 0834  BP: 122/84  Pulse: 79  SpO2: 97%  Weight: 206 lb 6.4 oz (93.6 kg)  Height: 5\' 11"  (1.803 m)     GEN- The patient is well appearing, alert and oriented x 3 today.   HEENT: normocephalic, atraumatic; sclera clear, conjunctiva pink; hearing intact; oropharynx clear; neck supple, no JVP Lymph- no cervical lymphadenopathy Lungs- Clear to ausculation bilaterally, normal work of breathing.  No wheezes, rales, rhonchi Heart- Regular rate and rhythm, no murmurs, rubs or gallops, PMI not laterally displaced GI- soft, non-tender, non-distended, bowel sounds present,  no hepatosplenomegaly Extremities- no clubbing, cyanosis, or edema; DP/PT/radial pulses 2+ bilaterally MS- no significant deformity or atrophy Skin- warm and dry, no rash or lesion; ICD pocket well healed Psych- euthymic mood, full affect Neuro- strength and sensation are intact  ICD interrogation- reviewed in detail today,  See PACEART report  EKG:  EKG is ordered today. The ekg ordered today shows NSR at 79 bpm, with PR interval 194 ms, QRS 86 ms. No ST/TW changes compared to EKG 09/27/2017  Recent Labs: No results found for requested labs within last 8760 hours.   Wt Readings from Last 3 Encounters:  10/01/19 206 lb 6.4 oz (93.6 kg)  09/27/17 205 lb (93 kg)  01/05/16 201 lb 9.6 oz (91.4 kg)     Other studies Reviewed: Additional studies/ records that were reviewed today include: Previous echo, previous EP notes  Assessment and Plan:  1.  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  3. Chest pain He describes this as a "point tenderness" that appears more MSK in origin. He denies specific relieving or aggravating factors, but will try and "keep better track" of its occurrences.   Current medicines are reviewed at length with the patient today.   The patient does not have concerns regarding his medicines.  The following changes were made today:  none  Labs/ tests ordered today include:  Orders Placed This Encounter  Procedures  . CUP PACEART [place]  . EKG 12-Lead     Disposition:   Follow up with Dr. Caryl Comes in 12 months. Continue remotes q 3 months.  Jacalyn Lefevre, PA-C  10/01/2019 9:08 AM  [facility] [address] [place] [address] [phone] (office)  [phone] (fax)

## 2019-10-01 ENCOUNTER — Other Ambulatory Visit: Payer: Self-pay

## 2019-10-01 ENCOUNTER — Ambulatory Visit: Payer: 59 | Admitting: Student

## 2019-10-01 ENCOUNTER — Encounter: Payer: Self-pay | Admitting: Student

## 2019-10-01 VITALS — BP 122/84 | HR 79 | Ht 71.0 in | Wt 206.4 lb

## 2019-10-01 DIAGNOSIS — Z95810 Presence of automatic (implantable) cardiac defibrillator: Secondary | ICD-10-CM

## 2019-10-01 DIAGNOSIS — I469 Cardiac arrest, cause unspecified: Secondary | ICD-10-CM

## 2019-10-01 DIAGNOSIS — Q999 Chromosomal abnormality, unspecified: Secondary | ICD-10-CM | POA: Diagnosis not present

## 2019-10-01 LAB — CUP PACEART INCLINIC DEVICE CHECK
Battery Remaining Longevity: 31 mo
Brady Statistic RV Percent Paced: 0 %
Date Time Interrogation Session: 20210427090127
HighPow Impedance: 65.25 Ohm
HighPow Impedance: 65.25 Ohm
Implantable Lead Implant Date: 20120813
Implantable Lead Location: 753860
Implantable Lead Model: 181
Implantable Lead Serial Number: 308767
Implantable Pulse Generator Implant Date: 20120813
Lead Channel Impedance Value: 437.5 Ohm
Lead Channel Impedance Value: 437.5 Ohm
Lead Channel Pacing Threshold Amplitude: 1 V
Lead Channel Pacing Threshold Amplitude: 1 V
Lead Channel Pacing Threshold Amplitude: 1 V
Lead Channel Pacing Threshold Amplitude: 1 V
Lead Channel Pacing Threshold Pulse Width: 0.5 ms
Lead Channel Pacing Threshold Pulse Width: 0.5 ms
Lead Channel Pacing Threshold Pulse Width: 0.5 ms
Lead Channel Pacing Threshold Pulse Width: 0.5 ms
Lead Channel Sensing Intrinsic Amplitude: 11.7 mV
Lead Channel Sensing Intrinsic Amplitude: 11.7 mV
Lead Channel Setting Pacing Amplitude: 2.5 V
Lead Channel Setting Pacing Pulse Width: 0.5 ms
Lead Channel Setting Sensing Sensitivity: 0.5 mV
Pulse Gen Serial Number: 1017632

## 2019-10-01 NOTE — Patient Instructions (Signed)
Medication Instructions:  none *If you need a refill on your cardiac medications before your next appointment, please call your pharmacy*   Lab Work: none If you have labs (blood work) drawn today and your tests are completely normal, you will receive your results only by: Marland Kitchen MyChart Message (if you have MyChart) OR . A paper copy in the mail If you have any lab test that is abnormal or we need to change your treatment, we will call you to review the results.   Testing/Procedures: none   Follow-Up: At [facility], you and your health needs are our priority.  As part of our continuing mission to provide you with exceptional heart care, we have created designated Provider Care Teams.  These Care Teams include your primary Cardiologist (physician) and Advanced Practice Providers (APPs -  Physician Assistants and Nurse Practitioners) who all work together to provide you with the care you need, when you need it.  We recommend signing up for the patient portal called "MyChart".  Sign up information is provided on this After Visit Summary.  MyChart is used to connect with patients for Virtual Visits (Telemedicine).  Patients are able to view lab/test results, encounter notes, upcoming appointments, etc.  Non-urgent messages can be sent to your provider as well.   To learn more about what you can do with MyChart, go to [email or web address].    Your next appointment:   1 year(s)  The format for your next appointment:   Either In Person or Virtual  Provider:   Dr Graciela Husbands   Other Instructions Remote monitoring is used to monitor your ICD from home. This monitoring reduces the number of office visits required to check your device to one time per year. It allows [place] to keep an eye on the functioning of your device to ensure it is working properly. You are scheduled for a device check from home on 12/03/19. You may send your transmission at any time that day. If you have a wireless device, the  transmission will be sent automatically. After your physician reviews your transmission, you will receive a postcard with your next transmission date.

## 2019-10-02 ENCOUNTER — Telehealth: Payer: Self-pay | Admitting: Student

## 2019-10-02 NOTE — Telephone Encounter (Signed)
Reassured wife that battery has 2.5 years and answered questions concerning procedure to replace it.

## 2019-10-02 NOTE — Telephone Encounter (Signed)
Pt has greater than 2.5 years left on his battery life; there were no concerns with his battery at his appointment.    He asked me what the procedure was like to replace when he eventually needs it, and I explained it is a small incision at the location of his current device to change out the generator of the pacemaker, takes about 30 minutes for the doctor to do, and is an outpatient procedure.   Please reassure here there  were no concerns about his battery life.  Casimiro Needle [address] [place], [place]

## 2019-10-02 NOTE — Telephone Encounter (Signed)
Annielee the patient's husband is calling requesting a nurse call her to inform her what Casimiro Needle told the patient as well as what he advised due to [name] not remembering exactly what was said. Please advise.

## 2019-10-02 NOTE — Telephone Encounter (Signed)
Patient's wife (DPR) is concerned about what Otilio Saber PA or his nurse discussed with patient yesterday. Patient's wife was asking about battery life on his device. Will forward to [facility], [place], and his nurse to help answer all her questions.

## 2019-10-08 ENCOUNTER — Encounter: Payer: 59 | Admitting: Internal Medicine

## 2019-11-17 ENCOUNTER — Other Ambulatory Visit: Payer: Self-pay | Admitting: Internal Medicine

## 2019-12-03 ENCOUNTER — Ambulatory Visit (INDEPENDENT_AMBULATORY_CARE_PROVIDER_SITE_OTHER): Payer: 59 | Admitting: *Deleted

## 2019-12-03 DIAGNOSIS — I469 Cardiac arrest, cause unspecified: Secondary | ICD-10-CM

## 2019-12-03 LAB — CUP PACEART REMOTE DEVICE CHECK
Battery Remaining Longevity: 30 mo
Battery Remaining Percentage: 27 %
Battery Voltage: 2.83 V
Brady Statistic RV Percent Paced: 0 %
Date Time Interrogation Session: 20210629044343
HighPow Impedance: 69 Ohm
HighPow Impedance: 69 Ohm
Implantable Lead Implant Date: 20120813
Implantable Lead Location: 753860
Implantable Lead Model: 181
Implantable Lead Serial Number: 308767
Implantable Pulse Generator Implant Date: 20120813
Lead Channel Impedance Value: 430 Ohm
Lead Channel Pacing Threshold Amplitude: 1 V
Lead Channel Pacing Threshold Pulse Width: 0.5 ms
Lead Channel Sensing Intrinsic Amplitude: 11.7 mV
Lead Channel Setting Pacing Amplitude: 2.5 V
Lead Channel Setting Pacing Pulse Width: 0.5 ms
Lead Channel Setting Sensing Sensitivity: 0.5 mV
Pulse Gen Serial Number: 1017632

## 2019-12-04 NOTE — Progress Notes (Signed)
Remote ICD transmission.   

## 2020-03-04 ENCOUNTER — Ambulatory Visit (INDEPENDENT_AMBULATORY_CARE_PROVIDER_SITE_OTHER): Payer: 59 | Admitting: Emergency Medicine

## 2020-03-04 DIAGNOSIS — I469 Cardiac arrest, cause unspecified: Secondary | ICD-10-CM

## 2020-03-05 LAB — CUP PACEART REMOTE DEVICE CHECK
Battery Remaining Longevity: 28 mo
Battery Remaining Percentage: 25 %
Battery Voltage: 2.81 V
Brady Statistic RV Percent Paced: 1 %
Date Time Interrogation Session: 20210929051923
HighPow Impedance: 70 Ohm
HighPow Impedance: 70 Ohm
Implantable Lead Implant Date: 20120813
Implantable Lead Location: 753860
Implantable Lead Model: 181
Implantable Lead Serial Number: 308767
Implantable Pulse Generator Implant Date: 20120813
Lead Channel Impedance Value: 450 Ohm
Lead Channel Pacing Threshold Amplitude: 1 V
Lead Channel Pacing Threshold Pulse Width: 0.5 ms
Lead Channel Sensing Intrinsic Amplitude: 11.7 mV
Lead Channel Setting Pacing Amplitude: 2.5 V
Lead Channel Setting Pacing Pulse Width: 0.5 ms
Lead Channel Setting Sensing Sensitivity: 0.5 mV
Pulse Gen Serial Number: 1017632

## 2020-03-06 NOTE — Progress Notes (Signed)
Remote ICD transmission.   

## 2020-06-03 ENCOUNTER — Ambulatory Visit (INDEPENDENT_AMBULATORY_CARE_PROVIDER_SITE_OTHER): Payer: 59

## 2020-06-03 DIAGNOSIS — I469 Cardiac arrest, cause unspecified: Secondary | ICD-10-CM | POA: Diagnosis not present

## 2020-06-03 LAB — CUP PACEART REMOTE DEVICE CHECK
Battery Remaining Longevity: 25 mo
Battery Remaining Percentage: 23 %
Battery Voltage: 2.8 V
Brady Statistic RV Percent Paced: 1 %
Date Time Interrogation Session: 20211229024928
HighPow Impedance: 65 Ohm
HighPow Impedance: 65 Ohm
Implantable Lead Implant Date: 20120813
Implantable Lead Location: 753860
Implantable Lead Model: 181
Implantable Lead Serial Number: 308767
Implantable Pulse Generator Implant Date: 20120813
Lead Channel Impedance Value: 430 Ohm
Lead Channel Pacing Threshold Amplitude: 1 V
Lead Channel Pacing Threshold Pulse Width: 0.5 ms
Lead Channel Sensing Intrinsic Amplitude: 11.7 mV
Lead Channel Setting Pacing Amplitude: 2.5 V
Lead Channel Setting Pacing Pulse Width: 0.5 ms
Lead Channel Setting Sensing Sensitivity: 0.5 mV
Pulse Gen Serial Number: 1017632

## 2020-06-17 NOTE — Progress Notes (Signed)
Remote ICD transmission.   

## 2020-09-02 ENCOUNTER — Ambulatory Visit (INDEPENDENT_AMBULATORY_CARE_PROVIDER_SITE_OTHER): Payer: 59

## 2020-09-02 DIAGNOSIS — I469 Cardiac arrest, cause unspecified: Secondary | ICD-10-CM | POA: Diagnosis not present

## 2020-09-03 LAB — CUP PACEART REMOTE DEVICE CHECK
Battery Remaining Longevity: 24 mo
Battery Remaining Percentage: 22 %
Battery Voltage: 2.78 V
Brady Statistic RV Percent Paced: 1 %
Date Time Interrogation Session: 20220331015018
HighPow Impedance: 65 Ohm
HighPow Impedance: 65 Ohm
Implantable Lead Implant Date: 20120813
Implantable Lead Location: 753860
Implantable Lead Model: 181
Implantable Lead Serial Number: 308767
Implantable Pulse Generator Implant Date: 20120813
Lead Channel Impedance Value: 440 Ohm
Lead Channel Pacing Threshold Amplitude: 1 V
Lead Channel Pacing Threshold Pulse Width: 0.5 ms
Lead Channel Sensing Intrinsic Amplitude: 11.7 mV
Lead Channel Setting Pacing Amplitude: 2.5 V
Lead Channel Setting Pacing Pulse Width: 0.5 ms
Lead Channel Setting Sensing Sensitivity: 0.5 mV
Pulse Gen Serial Number: 1017632

## 2020-09-15 NOTE — Progress Notes (Signed)
Remote ICD transmission.   

## 2020-11-30 ENCOUNTER — Telehealth: Payer: Self-pay

## 2020-11-30 NOTE — Telephone Encounter (Signed)
Merlin alert for Ventricular noise reversion (magnet revision) 06/24 @ 4:47.  Spoke with pt, he reports he was at work at time of episode and may have been using a power tool.  He does not recall any symptoms occurring.  Advised pt he needs to be aware that most power tools can affect his device and need to remain at least 6 minches from device.    Pt indicated he is overdue for apt and would like someone to contact him to schedule his routine follow-up appt.

## 2020-12-02 ENCOUNTER — Ambulatory Visit (INDEPENDENT_AMBULATORY_CARE_PROVIDER_SITE_OTHER): Payer: 59

## 2020-12-02 DIAGNOSIS — I469 Cardiac arrest, cause unspecified: Secondary | ICD-10-CM | POA: Diagnosis not present

## 2020-12-02 LAB — CUP PACEART REMOTE DEVICE CHECK
Battery Remaining Longevity: 18 mo
Battery Remaining Percentage: 16 %
Battery Voltage: 2.74 V
Brady Statistic RV Percent Paced: 1 %
Date Time Interrogation Session: 20220629020019
HighPow Impedance: 72 Ohm
HighPow Impedance: 72 Ohm
Implantable Lead Implant Date: 20120813
Implantable Lead Location: 753860
Implantable Lead Model: 181
Implantable Lead Serial Number: 308767
Implantable Pulse Generator Implant Date: 20120813
Lead Channel Impedance Value: 450 Ohm
Lead Channel Pacing Threshold Amplitude: 1 V
Lead Channel Pacing Threshold Pulse Width: 0.5 ms
Lead Channel Sensing Intrinsic Amplitude: 11.7 mV
Lead Channel Setting Pacing Amplitude: 2.5 V
Lead Channel Setting Pacing Pulse Width: 0.5 ms
Lead Channel Setting Sensing Sensitivity: 0.5 mV
Pulse Gen Serial Number: 1017632

## 2020-12-16 NOTE — Progress Notes (Signed)
Remote ICD transmission.   

## 2021-01-22 ENCOUNTER — Ambulatory Visit (INDEPENDENT_AMBULATORY_CARE_PROVIDER_SITE_OTHER): Payer: 59 | Admitting: Internal Medicine

## 2021-01-22 ENCOUNTER — Encounter: Payer: Self-pay | Admitting: Internal Medicine

## 2021-01-22 ENCOUNTER — Other Ambulatory Visit: Payer: Self-pay

## 2021-01-22 VITALS — BP 114/80 | HR 95 | Ht 71.0 in | Wt 198.4 lb

## 2021-01-22 DIAGNOSIS — Z95810 Presence of automatic (implantable) cardiac defibrillator: Secondary | ICD-10-CM | POA: Diagnosis not present

## 2021-01-22 DIAGNOSIS — I469 Cardiac arrest, cause unspecified: Secondary | ICD-10-CM | POA: Diagnosis not present

## 2021-01-22 MED ORDER — PROPRANOLOL HCL ER 60 MG PO CP24: 60.0000 mg | ORAL_CAPSULE | Freq: Every day | ORAL | 3 refills | Status: DC

## 2021-01-22 NOTE — Progress Notes (Signed)
      Patient Care Team: Knox Royalty, MD as PCP - General (Family Medicine)   HPI  [name] is a 45 y.o. 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  He takes Inderal LA 60  Today, he appears well overall. His new PCP Dr. Yetta Barre follows his cholesterol management.. He has had success with changing his diet  hopes to bring cholesterol under control. Sugar is better  The patient denies chest pain, shortness of breath, nocturnal dyspnea, orthopnea or peripheral edema.  There have been no palpitations, lightheadedness or syncope.   DATE TEST EF    8/12 cMRI  49 %    5/16 Echo   60-65 %             Patient  Date Cr K Hgb  8/12 1.03 3.9 14.8  5/16   15.3    Records and Results Reviewed with  Past Medical History:  Diagnosis Date   Cardiac arrest - ventricular fibrillation    a. s/p STJ ICD   Sinus tachycardia     Past Surgical History:  Procedure Laterality Date   CARDIAC CATHETERIZATION     CARDIAC DEFIBRILLATOR PLACEMENT     St. Jude 1257-40   cardiac MRI     DOPPLER ECHOCARDIOGRAPHY      Current Meds  Medication Sig   atorvastatin (LIPITOR) 40 MG tablet Take 40 mg by mouth daily.   propranolol ER (INDERAL LA) 60 MG 24 hr capsule Take 1 capsule (60 mg total) by mouth daily.    No Known Allergies    Review of Systems negative except from HPI and PMH  Physical Exam BP 114/80   Pulse 95   Ht 5\' 11"  (1.803 m)   Wt 198 lb 6.4 oz (90 kg)   SpO2 96%   BMI 27.67 kg/m  Well developed and well nourished in no acute distress HENT normal E scleral and icterus clear Neck Supple JVP flat; carotids brisk and full Clear to ausculation Regular rate and rhythm, no murmurs gallops or rub Soft with active bowel sounds No clubbing cyanosis  Edema Alert and oriented, grossly normal motor and sensory function Skin Warm and Dry  ECG sinus at 95 Interval 17/09/35  CrCl cannot be calculated  (Patient's most recent lab result is older than the maximum 21 days allowed.).   Assessment and  Plan  Cardiac arrest-reported  Lamin mutation  ICD-Saint Jude  Hyperlipidemia   No interval arrhythmias.  We will continue his Inderal 60 mg daily.  DEvice function normal   Discussed his lipid issue.  We will get his lipid measurements and assess his cardiovascular risk and then decide regarding the potential benefits of a calcium score to further stratify risk and decide whether he needs ongoing statin therapy    Current medicines are reviewed at length with the patient today .  The patient does not  have concerns regarding medicines.   I,Mathew Stumpf,acting as a scribe for 19/09/35, MD.,have documented all relevant documentation on the behalf of Sherryl Manges, MD,as directed by  Sherryl Manges, MD while in the presence of Sherryl Manges, MD.  I, Sherryl Manges, MD, have reviewed all documentation for this visit. The documentation on 01/22/21 for the exam, diagnosis, procedures, and orders are all accurate and complete.

## 2021-01-22 NOTE — Patient Instructions (Signed)

## 2021-03-03 ENCOUNTER — Ambulatory Visit (INDEPENDENT_AMBULATORY_CARE_PROVIDER_SITE_OTHER): Payer: 59

## 2021-03-03 DIAGNOSIS — I469 Cardiac arrest, cause unspecified: Secondary | ICD-10-CM

## 2021-03-04 LAB — CUP PACEART REMOTE DEVICE CHECK
Battery Remaining Longevity: 13 mo
Battery Remaining Percentage: 11 %
Battery Voltage: 2.69 V
Brady Statistic RV Percent Paced: 0 %
Date Time Interrogation Session: 20220928072451
HighPow Impedance: 68 Ohm
HighPow Impedance: 68 Ohm
Implantable Lead Implant Date: 20120813
Implantable Lead Location: 753860
Implantable Lead Model: 181
Implantable Lead Serial Number: 308767
Implantable Pulse Generator Implant Date: 20120813
Lead Channel Impedance Value: 430 Ohm
Lead Channel Pacing Threshold Amplitude: 1 V
Lead Channel Pacing Threshold Pulse Width: 0.5 ms
Lead Channel Sensing Intrinsic Amplitude: 11.7 mV
Lead Channel Setting Pacing Amplitude: 2.5 V
Lead Channel Setting Pacing Pulse Width: 0.5 ms
Lead Channel Setting Sensing Sensitivity: 0.5 mV
Pulse Gen Serial Number: 1017632

## 2021-03-10 NOTE — Progress Notes (Signed)
Remote ICD transmission.   

## 2021-06-02 ENCOUNTER — Ambulatory Visit (INDEPENDENT_AMBULATORY_CARE_PROVIDER_SITE_OTHER): Payer: 59

## 2021-06-02 DIAGNOSIS — I469 Cardiac arrest, cause unspecified: Secondary | ICD-10-CM

## 2021-06-02 LAB — CUP PACEART REMOTE DEVICE CHECK
Battery Remaining Longevity: 10 mo
Battery Remaining Percentage: 9 %
Battery Voltage: 2.68 V
Brady Statistic RV Percent Paced: 1 %
Date Time Interrogation Session: 20221228025214
HighPow Impedance: 72 Ohm
HighPow Impedance: 72 Ohm
Implantable Lead Implant Date: 20120813
Implantable Lead Location: 753860
Implantable Lead Model: 181
Implantable Lead Serial Number: 308767
Implantable Pulse Generator Implant Date: 20120813
Lead Channel Impedance Value: 430 Ohm
Lead Channel Pacing Threshold Amplitude: 1 V
Lead Channel Pacing Threshold Pulse Width: 0.5 ms
Lead Channel Sensing Intrinsic Amplitude: 11.7 mV
Lead Channel Setting Pacing Amplitude: 2.5 V
Lead Channel Setting Pacing Pulse Width: 0.5 ms
Lead Channel Setting Sensing Sensitivity: 0.5 mV
Pulse Gen Serial Number: 1017632

## 2021-06-14 NOTE — Progress Notes (Signed)
Remote ICD transmission.   

## 2021-09-01 ENCOUNTER — Ambulatory Visit (INDEPENDENT_AMBULATORY_CARE_PROVIDER_SITE_OTHER): Payer: 59

## 2021-09-01 DIAGNOSIS — I469 Cardiac arrest, cause unspecified: Secondary | ICD-10-CM

## 2021-09-02 LAB — CUP PACEART REMOTE DEVICE CHECK
Battery Remaining Longevity: 9 mo
Battery Remaining Percentage: 7 %
Battery Voltage: 2.65 V
Brady Statistic RV Percent Paced: 1 %
Date Time Interrogation Session: 20230330022453
HighPow Impedance: 65 Ohm
HighPow Impedance: 65 Ohm
Implantable Lead Implant Date: 20120813
Implantable Lead Location: 753860
Implantable Lead Model: 181
Implantable Lead Serial Number: 308767
Implantable Pulse Generator Implant Date: 20120813
Lead Channel Impedance Value: 430 Ohm
Lead Channel Pacing Threshold Amplitude: 1 V
Lead Channel Pacing Threshold Pulse Width: 0.5 ms
Lead Channel Sensing Intrinsic Amplitude: 11.7 mV
Lead Channel Setting Pacing Amplitude: 2.5 V
Lead Channel Setting Pacing Pulse Width: 0.5 ms
Lead Channel Setting Sensing Sensitivity: 0.5 mV
Pulse Gen Serial Number: 1017632

## 2021-09-14 NOTE — Progress Notes (Signed)
Remote ICD transmission.   

## 2021-11-13 ENCOUNTER — Emergency Department (HOSPITAL_COMMUNITY): Payer: 59

## 2021-11-13 ENCOUNTER — Other Ambulatory Visit: Payer: Self-pay

## 2021-11-13 ENCOUNTER — Encounter (HOSPITAL_COMMUNITY): Payer: Self-pay

## 2021-11-13 ENCOUNTER — Emergency Department (HOSPITAL_COMMUNITY)
Admission: EM | Admit: 2021-11-13 | Discharge: 2021-11-13 | Disposition: A | Discharge status: Home / Self Care | Payer: 59 | Attending: Emergency Medicine | Admitting: Emergency Medicine

## 2021-11-13 DIAGNOSIS — Y92410 Unspecified street and highway as the place of occurrence of the external cause: Secondary | ICD-10-CM | POA: Insufficient documentation

## 2021-11-13 DIAGNOSIS — M545 Low back pain, unspecified: Secondary | ICD-10-CM | POA: Diagnosis not present

## 2021-11-13 DIAGNOSIS — S0081XA Abrasion of other part of head, initial encounter: Secondary | ICD-10-CM | POA: Diagnosis not present

## 2021-11-13 DIAGNOSIS — S0990XA Unspecified injury of head, initial encounter: Secondary | ICD-10-CM

## 2021-11-13 DIAGNOSIS — R0789 Other chest pain: Secondary | ICD-10-CM | POA: Diagnosis not present

## 2021-11-13 DIAGNOSIS — E875 Hyperkalemia: Secondary | ICD-10-CM | POA: Diagnosis not present

## 2021-11-13 DIAGNOSIS — S3991XA Unspecified injury of abdomen, initial encounter: Secondary | ICD-10-CM | POA: Diagnosis not present

## 2021-11-13 DIAGNOSIS — S80212A Abrasion, left knee, initial encounter: Secondary | ICD-10-CM | POA: Insufficient documentation

## 2021-11-13 DIAGNOSIS — S80211A Abrasion, right knee, initial encounter: Secondary | ICD-10-CM | POA: Insufficient documentation

## 2021-11-13 DIAGNOSIS — M546 Pain in thoracic spine: Secondary | ICD-10-CM | POA: Insufficient documentation

## 2021-11-13 DIAGNOSIS — R000 Tachycardia, unspecified: Secondary | ICD-10-CM | POA: Diagnosis not present

## 2021-11-13 DIAGNOSIS — S50811A Abrasion of right forearm, initial encounter: Secondary | ICD-10-CM | POA: Insufficient documentation

## 2021-11-13 DIAGNOSIS — Z950 Presence of cardiac pacemaker: Secondary | ICD-10-CM | POA: Diagnosis not present

## 2021-11-13 LAB — I-STAT CHEM 8, ED
BUN: 11 mg/dL (ref 6–20)
Calcium, Ion: 1.26 mmol/L (ref 1.15–1.40)
Chloride: 102 mmol/L (ref 98–111)
Creatinine, Ser: 1.5 mg/dL — ABNORMAL HIGH (ref 0.61–1.24)
Glucose, Bld: 116 mg/dL — ABNORMAL HIGH (ref 70–99)
HCT: 46 % (ref 39.0–52.0)
Hemoglobin: 15.6 g/dL (ref 13.0–17.0)
Potassium: 5.8 mmol/L — ABNORMAL HIGH (ref 3.5–5.1)
Sodium: 139 mmol/L (ref 135–145)
TCO2: 30 mmol/L (ref 22–32)

## 2021-11-13 LAB — COMPREHENSIVE METABOLIC PANEL
ALT: 17 U/L (ref 0–44)
AST: 41 U/L (ref 15–41)
Albumin: 3.8 g/dL (ref 3.5–5.0)
Alkaline Phosphatase: 67 U/L (ref 38–126)
Anion gap: 9 (ref 5–15)
BUN: 10 mg/dL (ref 6–20)
CO2: 25 mmol/L (ref 22–32)
Calcium: 10.3 mg/dL (ref 8.9–10.3)
Chloride: 102 mmol/L (ref 98–111)
Creatinine, Ser: 1.41 mg/dL — ABNORMAL HIGH (ref 0.61–1.24)
GFR, Estimated: >60 mL/min (ref >60)
Glucose, Bld: 121 mg/dL — ABNORMAL HIGH (ref 70–99)
Potassium: 5.2 mmol/L — ABNORMAL HIGH (ref 3.5–5.1)
Sodium: 136 mmol/L (ref 135–145)
Total Bilirubin: 1.4 mg/dL — ABNORMAL HIGH (ref 0.3–1.2)
Total Protein: 7.2 g/dL (ref 6.5–8.1)

## 2021-11-13 LAB — LACTIC ACID, PLASMA: Lactic Acid, Venous: 2.5 mmol/L (ref 0.5–1.9)

## 2021-11-13 LAB — CBC
HCT: 45.8 % (ref 39.0–52.0)
Hemoglobin: 15.2 g/dL (ref 13.0–17.0)
MCH: 30.7 pg (ref 26.0–34.0)
MCHC: 33.2 g/dL (ref 30.0–36.0)
MCV: 92.5 fL (ref 80.0–100.0)
Platelets: 256 10*3/uL (ref 150–400)
RBC: 4.95 MIL/uL (ref 4.22–5.81)
RDW: 12.4 % (ref 11.5–15.5)
WBC: 5.3 10*3/uL (ref 4.0–10.5)
nRBC: 0 % (ref 0.0–0.2)

## 2021-11-13 LAB — SAMPLE TO BLOOD BANK

## 2021-11-13 LAB — ETHANOL: Alcohol, Ethyl (B): <10 mg/dL (ref <10)

## 2021-11-13 MED ORDER — PROPRANOLOL HCL ER 60 MG PO CP24
60.0000 mg | ORAL_CAPSULE | Freq: Once | ORAL | Status: AC
  Administered 2021-11-13: 60 mg via ORAL
  Filled 2021-11-13: qty 1

## 2021-11-13 MED ORDER — METHOCARBAMOL 500 MG PO TABS: 500.0000 mg | ORAL_TABLET | Freq: Two times a day (BID) | ORAL | 0 refills | Status: DC

## 2021-11-13 MED ORDER — IOHEXOL 300 MG/ML  SOLN
100.0000 mL | Freq: Once | INTRAMUSCULAR | Status: AC | PRN
  Administered 2021-11-13: 100 mL via INTRAVENOUS
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
## 2021-11-13 NOTE — ED Notes (Signed)
Pt transported to CT at this time by TRN on monitor
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
## 2021-11-13 NOTE — ED Notes (Signed)
Pt back from CT
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
## 2021-11-13 NOTE — Discharge Instructions (Addendum)
The pain you are experiencing is likely due to muscle strain, you may take Ibuprofen or Naprosyn and Robaxin as needed for pain management. Do not combine with any pain reliever other than tylenol.  You may also use ice and heat, and over-the-counter remedies such as Biofreeze gel or salon pas lidocaine patches. The muscle soreness should improve over the next week. Follow up with your family doctor in the next week for a recheck if you are still having symptoms. Return to ED if pain is worsening, you develop weakness or numbness of extremities, or new or concerning symptoms develop. ° °

## 2021-11-13 NOTE — ED Notes (Signed)
This NT got the pt up and walking around the room patient stated he felt fine and was able to walk and stand on his own. After walking around pt stated he felt no light headedness or lethargy. Pt is sitting up in his bed at this time.
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
## 2021-12-01 ENCOUNTER — Ambulatory Visit (INDEPENDENT_AMBULATORY_CARE_PROVIDER_SITE_OTHER): Payer: 59

## 2021-12-01 DIAGNOSIS — I469 Cardiac arrest, cause unspecified: Secondary | ICD-10-CM | POA: Diagnosis not present

## 2021-12-01 LAB — CUP PACEART REMOTE DEVICE CHECK
Battery Remaining Longevity: 5 mo
Battery Remaining Percentage: 4 %
Battery Voltage: 2.62 V
Brady Statistic RV Percent Paced: 1 %
Date Time Interrogation Session: 20230628020031
HighPow Impedance: 70 Ohm
HighPow Impedance: 70 Ohm
Implantable Lead Implant Date: 20120813
Implantable Lead Location: 753860
Implantable Lead Model: 181
Implantable Lead Serial Number: 308767
Implantable Pulse Generator Implant Date: 20120813
Lead Channel Impedance Value: 400 Ohm
Lead Channel Pacing Threshold Amplitude: 1 V
Lead Channel Pacing Threshold Pulse Width: 0.5 ms
Lead Channel Sensing Intrinsic Amplitude: 11.7 mV
Lead Channel Setting Pacing Amplitude: 2.5 V
Lead Channel Setting Pacing Pulse Width: 0.5 ms
Lead Channel Setting Sensing Sensitivity: 0.5 mV
Pulse Gen Serial Number: 1017632

## 2021-12-21 NOTE — Progress Notes (Signed)
Remote ICD transmission.   

## 2022-01-31 ENCOUNTER — Other Ambulatory Visit: Payer: Self-pay | Admitting: Internal Medicine

## 2022-01-31 ENCOUNTER — Ambulatory Visit: Payer: 59 | Attending: Internal Medicine | Admitting: Internal Medicine

## 2022-01-31 ENCOUNTER — Encounter: Payer: Self-pay | Admitting: Internal Medicine

## 2022-01-31 VITALS — BP 132/82 | HR 91 | Ht 71.0 in | Wt 209.0 lb

## 2022-01-31 DIAGNOSIS — I469 Cardiac arrest, cause unspecified: Secondary | ICD-10-CM | POA: Diagnosis not present

## 2022-01-31 DIAGNOSIS — Z95810 Presence of automatic (implantable) cardiac defibrillator: Secondary | ICD-10-CM

## 2022-01-31 LAB — CUP PACEART INCLINIC DEVICE CHECK
Battery Remaining Longevity: 2 mo
Brady Statistic RV Percent Paced: 0 %
Date Time Interrogation Session: 20230828155500
HighPow Impedance: 75.375
Implantable Lead Implant Date: 20120813
Implantable Lead Location: 753860
Implantable Lead Model: 181
Implantable Lead Serial Number: 308767
Implantable Pulse Generator Implant Date: 20120813
Lead Channel Impedance Value: 437.5 Ohm
Lead Channel Pacing Threshold Amplitude: 1 V
Lead Channel Pacing Threshold Amplitude: 1 V
Lead Channel Pacing Threshold Pulse Width: 0.5 ms
Lead Channel Pacing Threshold Pulse Width: 0.5 ms
Lead Channel Sensing Intrinsic Amplitude: 11.7 mV
Lead Channel Setting Pacing Amplitude: 2.5 V
Lead Channel Setting Pacing Pulse Width: 0.5 ms
Lead Channel Setting Sensing Sensitivity: 0.5 mV
Pulse Gen Serial Number: 1017632

## 2022-01-31 NOTE — Patient Instructions (Addendum)
Medication Instructions:  Your physician recommends that you continue on your current medications as directed. Please refer to the Current Medication list given to you today.  *If you need a refill on your cardiac medications before your next appointment, please call your pharmacy*  Lab Work: YOU WILL HAVE  BLOOD WORK DRAWN TODAY: BMET   Testing/Procedures: None ordered.  Follow-Up:  IN 4 months with Dr. Odessa Fleming, [facility] on [address].   Remote monitoring is used to monitor your ICD from home. This monitoring reduces the number of office visits required to check your device to one time per year. It allows [place] to keep an eye on the functioning of your device to ensure it is working properly. You are scheduled for a device check from home on 03/02/22. You may send your transmission at any time that day. If you have a wireless device, the transmission will be sent automatically. After your physician reviews your transmission, you will receive a postcard with your next transmission date.  Important Information About Sugar

## 2022-01-31 NOTE — Progress Notes (Signed)
      Patient Care Team: Knox Royalty, MD as PCP - General (Family Medicine)   HPI  [name] is a 46 y.o. 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  He takes Inderal LA 60  The patient denies chest pain, shortness of breath, nocturnal dyspnea, orthopnea or peripheral edema.  There have been no palpitations, lightheadedness or syncope.    DATE TEST EF    8/12 cMRI  49 %    5/16 Echo   60-65 %             Patient  Date Cr K Hgb  8/12 1.03 3.9 14.8  5/16   15.3  6/23 1.5 5.8 15.6    Records and Results Reviewed with  Past Medical History:  Diagnosis Date   Cardiac arrest - ventricular fibrillation    a. s/p STJ ICD   Sinus tachycardia     Past Surgical History:  Procedure Laterality Date   CARDIAC CATHETERIZATION     CARDIAC DEFIBRILLATOR PLACEMENT     St. Jude 1257-40   cardiac MRI     DOPPLER ECHOCARDIOGRAPHY      Current Meds  Medication Sig   propranolol ER (INDERAL LA) 60 MG 24 hr capsule Take 1 capsule (60 mg total) by mouth daily.    No Known Allergies    Review of Systems negative except from HPI and PMH  Physical Exam BP 132/82   Pulse 91   Ht 5\' 11"  (1.803 m)   Wt 209 lb (94.8 kg)   SpO2 96%   BMI 29.15 kg/m  Well developed and well nourished in no acute distress HENT normal Neck supple with JVP-flat Clear Device pocket well healed; without hematoma or erythema.  There is no tethering  Regular rate and rhythm, no  murmur Abd-soft with active BS No Clubbing cyanosis   edema Skin-warm and dry A & Oriented  Grossly normal sensory and motor function  ECG sinus at 95 Interval 16/09/34  CrCl cannot be calculated (Patient's most recent lab result is older than the maximum 21 days allowed.).   Assessment and  Plan  Cardiac arrest-reported  Lamin mutation  ICD-Saint Jude device approaching ERI  Hyperkalemia  No interval arrhythmias of which he is aware  Not  clear if there was family counseling following the identification for Lamin mutation, have reached out to Dr. 18/09/34.  Device approaching ERI.  We will probably try to get it changed out around Christmas time.  Have reviewed risks and benefits including but not limited to infection.  Last potassium level was significantly elevated; will repeat

## 2022-02-01 LAB — BASIC METABOLIC PANEL
BUN/Creatinine Ratio: 9 (ref 9–20)
BUN: 11 mg/dL (ref 6–24)
CO2: 21 mmol/L (ref 20–29)
Calcium: 11 mg/dL — ABNORMAL HIGH (ref 8.7–10.2)
Chloride: 102 mmol/L (ref 96–106)
Creatinine, Ser: 1.25 mg/dL (ref 0.76–1.27)
Glucose: 98 mg/dL (ref 70–99)
Potassium: 4.6 mmol/L (ref 3.5–5.2)
Sodium: 138 mmol/L (ref 134–144)
eGFR: 72 mL/min/{1.73_m2} (ref >59)

## 2022-02-20 ENCOUNTER — Telehealth: Payer: Self-pay | Admitting: Physician Assistant

## 2022-02-20 NOTE — Telephone Encounter (Signed)
46 year old male status post ICD for history of cardiac arrest.  He was last seen in device clinic 01/31/2022.  Notes indicate he had <3 months until ERI.  He called the answering service today with symptoms of his device vibrating twice.  He has not had chest pain, syncope.  He denies any shocks from his device.  PLAN: Device is reached ERI I will send a message to scheduling to make sure the patient is brought in for follow-up. Richardson Dopp, PA-C 02/20/2022 12:03 PM

## 2022-02-21 ENCOUNTER — Telehealth: Payer: Self-pay

## 2022-02-21 NOTE — Telephone Encounter (Signed)
ICD has reached RRT 02/20/22. Patient was aware per on call note over the weekend 02/20/22.  Will need in-clinic apt to discuss gen change.

## 2022-02-22 NOTE — Telephone Encounter (Signed)
Pt has an appointment 03/07/2022 with Tommye Standard, PA-C to discuss generator change.

## 2022-02-22 NOTE — Telephone Encounter (Signed)
Pt has an appointment 03/07/2022 with Renee Ursuy, PA-C to discuss generator change. 

## 2022-03-02 ENCOUNTER — Ambulatory Visit (INDEPENDENT_AMBULATORY_CARE_PROVIDER_SITE_OTHER): Payer: 59

## 2022-03-02 DIAGNOSIS — I469 Cardiac arrest, cause unspecified: Secondary | ICD-10-CM | POA: Diagnosis not present

## 2022-03-02 LAB — CUP PACEART REMOTE DEVICE CHECK
Battery Remaining Longevity: 0 mo
Battery Voltage: 2.59 V
Brady Statistic RV Percent Paced: 0 %
Date Time Interrogation Session: 20230927012655
HighPow Impedance: 68 Ohm
HighPow Impedance: 68 Ohm
Implantable Lead Implant Date: 20120813
Implantable Lead Location: 753860
Implantable Lead Model: 181
Implantable Lead Serial Number: 308767
Implantable Pulse Generator Implant Date: 20120813
Lead Channel Impedance Value: 400 Ohm
Lead Channel Pacing Threshold Amplitude: 1 V
Lead Channel Pacing Threshold Pulse Width: 0.5 ms
Lead Channel Sensing Intrinsic Amplitude: 11.7 mV
Lead Channel Setting Pacing Amplitude: 2.5 V
Lead Channel Setting Pacing Pulse Width: 0.5 ms
Lead Channel Setting Sensing Sensitivity: 0.5 mV
Pulse Gen Serial Number: 1017632

## 2022-03-06 NOTE — H&P (View-Only) (Signed)
Cardiology Office Note Date:  03/07/2022  Patient ID:  [name] [name] 18-Feb-1976, MRN [id number] PCP:  Kristie Cowman, MD  Electrophysiologist: Dr. Caryl Comes  Chief Complaint:  discuss gen change  History of Present Illness: [name] [name] is a 46 y.o. male with history of cardiac arrest w/ Lamin mutation.  He saw Dr. Caryl Comes 01/31/22, feeling well, maintained on Ideral LA., device was approaching ERI, discussed procedure, likely to need towards  Dec.  Planned for updated labs with hx of hyperkalemia.  Also reached out to Dr. Broadus John for discussion of family counseling.    TODAY He feels quite well No CP, palpitations SOB No exertional intolerances. No near syncope or syncope. No shocks   Device information Abbott single chamber ICD implanted 01/17/2011 Secondary prevention  Past Medical History:  Diagnosis Date   Cardiac arrest - ventricular fibrillation    a. s/p STJ ICD   Sinus tachycardia     Past Surgical History:  Procedure Laterality Date   CARDIAC CATHETERIZATION     CARDIAC DEFIBRILLATOR PLACEMENT     St. Jude 1257-40   cardiac MRI     DOPPLER ECHOCARDIOGRAPHY      Current Outpatient Medications  Medication Sig Dispense Refill   propranolol ER (INDERAL LA) 60 MG 24 hr capsule TAKE 1 CAPSULE BY MOUTH EVERY DAY 90 capsule 3   atorvastatin (LIPITOR) 40 MG tablet Take 40 mg by mouth daily. (Patient not taking: Reported on 01/31/2022)     No current facility-administered medications for this visit.    Allergies:   Patient has no known allergies.   Social History:  The patient  reports that he has never smoked. He has never used smokeless tobacco. He reports that he does not drink alcohol and does not use drugs.   Family History:  The patient's family history includes Thrombosis in his mother.  ROS:  Please see the history of present illness.    All other systems are reviewed and otherwise negative.   PHYSICAL EXAM:  VS:  BP (!) 124/92   Pulse 84   Ht  5\' 11"  (1.803 m)   Wt 207 lb (93.9 kg)   SpO2 98%   BMI 28.87 kg/m  BMI: Body mass index is 28.87 kg/m. Well nourished, well developed, in no acute distress HEENT: normocephalic, atraumatic Neck: no JVD, carotid bruits or masses Cardiac:   RRR; no significant murmurs, no rubs, or gallops Lungs:   CTA b/l, no wheezing, rhonchi or rales Abd: soft, nontender MS: no deformity or  atrophy Ext: no edema Skin: warm and dry, no rash Neuro:  No gross deficits appreciated Psych: euthymic mood, full affect  ICD site is stable, no tethering or discomfort   EKG:  Done today and reviewed by myself shows  SR 84bpm, unchanged  Device interrogation done today and reviewed by myself:  Battery reached ERI 02/20/22 No arrhythmias VP zero%  10/17/2014: TTE Study Conclusions  - Left ventricle: The cavity size was normal. Wall thickness was    normal. Systolic function was normal. The estimated ejection    fraction was in the range of 55% to 60%.  - Mitral valve: There was mild regurgitation.    Recent Labs: 11/13/2021: ALT 17; Hemoglobin 15.6; Platelets 256 01/31/2022: BUN 11; Creatinine, Ser 1.25; Potassium 4.6; Sodium 138  No results found for requested labs within last 365 days.   CrCl cannot be calculated (Patient's most recent lab result is older than the maximum 21 days allowed.).  Wt Readings from Last 3 Encounters:  03/07/22 207 lb (93.9 kg)  01/31/22 209 lb (94.8 kg)  11/13/21 200 lb (90.7 kg)     Other studies reviewed: Additional studies/records reviewed today include: summarized above  ASSESSMENT AND PLAN:  Cardiac arrest w/ Lamin mutation. ICD ERI is reached  We discussed generator change procedure, potential risks and benefits He is agreeable to proceed. Plan usual post procedure follow up  He also has an appt with Der. Jomarie Longs for genetics  Disposition: F/u as above  Current medicines are reviewed at length with the patient today.  The patient did not have any  concerns regarding medicines.  Norma Fredrickson, PA-C 03/07/2022 10:30 AM     [facility] [address] [place] [address] [phone] (office)  [phone] (fax)

## 2022-03-06 NOTE — Progress Notes (Unsigned)
Cardiology Office Note Date:  03/07/2022  Patient ID:  [name], [name] 18-Feb-1976, MRN [id number] PCP:  Kristie Cowman, MD  Electrophysiologist: Dr. Caryl Comes  Chief Complaint:  discuss gen change  History of Present Illness: [name] is a 46 y.o. male with history of cardiac arrest w/ Lamin mutation.  He saw Dr. Caryl Comes 01/31/22, feeling well, maintained on Ideral LA., device was approaching ERI, discussed procedure, likely to need towards  Dec.  Planned for updated labs with hx of hyperkalemia.  Also reached out to Dr. Broadus John for discussion of family counseling.    TODAY He feels quite well No CP, palpitations SOB No exertional intolerances. No near syncope or syncope. No shocks   Device information Abbott single chamber ICD implanted 01/17/2011 Secondary prevention  Past Medical History:  Diagnosis Date   Cardiac arrest - ventricular fibrillation    a. s/p STJ ICD   Sinus tachycardia     Past Surgical History:  Procedure Laterality Date   CARDIAC CATHETERIZATION     CARDIAC DEFIBRILLATOR PLACEMENT     St. Jude 1257-40   cardiac MRI     DOPPLER ECHOCARDIOGRAPHY      Current Outpatient Medications  Medication Sig Dispense Refill   propranolol ER (INDERAL LA) 60 MG 24 hr capsule TAKE 1 CAPSULE BY MOUTH EVERY DAY 90 capsule 3   atorvastatin (LIPITOR) 40 MG tablet Take 40 mg by mouth daily. (Patient not taking: Reported on 01/31/2022)     No current facility-administered medications for this visit.    Allergies:   Patient has no known allergies.   Social History:  The patient  reports that he has never smoked. He has never used smokeless tobacco. He reports that he does not drink alcohol and does not use drugs.   Family History:  The patient's family history includes Thrombosis in his mother.  ROS:  Please see the history of present illness.    All other systems are reviewed and otherwise negative.   PHYSICAL EXAM:  VS:  BP (!) 124/92   Pulse 84   Ht  5\' 11"  (1.803 m)   Wt 207 lb (93.9 kg)   SpO2 98%   BMI 28.87 kg/m  BMI: Body mass index is 28.87 kg/m. Well nourished, well developed, in no acute distress HEENT: normocephalic, atraumatic Neck: no JVD, carotid bruits or masses Cardiac:   RRR; no significant murmurs, no rubs, or gallops Lungs:   CTA b/l, no wheezing, rhonchi or rales Abd: soft, nontender MS: no deformity or  atrophy Ext: no edema Skin: warm and dry, no rash Neuro:  No gross deficits appreciated Psych: euthymic mood, full affect  ICD site is stable, no tethering or discomfort   EKG:  Done today and reviewed by myself shows  SR 84bpm, unchanged  Device interrogation done today and reviewed by myself:  Battery reached ERI 02/20/22 No arrhythmias VP zero%  10/17/2014: TTE Study Conclusions  - Left ventricle: The cavity size was normal. Wall thickness was    normal. Systolic function was normal. The estimated ejection    fraction was in the range of 55% to 60%.  - Mitral valve: There was mild regurgitation.    Recent Labs: 11/13/2021: ALT 17; Hemoglobin 15.6; Platelets 256 01/31/2022: BUN 11; Creatinine, Ser 1.25; Potassium 4.6; Sodium 138  No results found for requested labs within last 365 days.   CrCl cannot be calculated (Patient's most recent lab result is older than the maximum 21 days allowed.).  Wt Readings from Last 3 Encounters:  03/07/22 207 lb (93.9 kg)  01/31/22 209 lb (94.8 kg)  11/13/21 200 lb (90.7 kg)     Other studies reviewed: Additional studies/records reviewed today include: summarized above  ASSESSMENT AND PLAN:  Cardiac arrest w/ Lamin mutation. ICD ERI is reached  We discussed generator change procedure, potential risks and benefits He is agreeable to proceed. Plan usual post procedure follow up  He also has an appt with Der. Jomarie Longs for genetics  Disposition: F/u as above  Current medicines are reviewed at length with the patient today.  The patient did not have any  concerns regarding medicines.  Norma Fredrickson, PA-C 03/07/2022 10:30 AM     [facility] [address] [place] [address] [phone] (office)  [phone] (fax)

## 2022-03-07 ENCOUNTER — Ambulatory Visit: Payer: 59 | Attending: Student | Admitting: Physician Assistant

## 2022-03-07 ENCOUNTER — Encounter: Payer: Self-pay | Admitting: Physician Assistant

## 2022-03-07 ENCOUNTER — Encounter: Payer: Self-pay | Admitting: *Deleted

## 2022-03-07 VITALS — BP 124/92 | HR 84 | Ht 71.0 in | Wt 207.0 lb

## 2022-03-07 DIAGNOSIS — I469 Cardiac arrest, cause unspecified: Secondary | ICD-10-CM | POA: Diagnosis not present

## 2022-03-07 DIAGNOSIS — Z95810 Presence of automatic (implantable) cardiac defibrillator: Secondary | ICD-10-CM

## 2022-03-07 DIAGNOSIS — Z01818 Encounter for other preprocedural examination: Secondary | ICD-10-CM | POA: Diagnosis not present

## 2022-03-07 LAB — CUP PACEART INCLINIC DEVICE CHECK
Battery Remaining Longevity: 0 mo
Brady Statistic RV Percent Paced: 0 %
Date Time Interrogation Session: 20231002130631
HighPow Impedance: 69.75 Ohm
Implantable Lead Implant Date: 20120813
Implantable Lead Location: 753860
Implantable Lead Model: 181
Implantable Lead Serial Number: 308767
Implantable Pulse Generator Implant Date: 20120813
Lead Channel Impedance Value: 437.5 Ohm
Lead Channel Pacing Threshold Amplitude: 1 V
Lead Channel Pacing Threshold Amplitude: 1 V
Lead Channel Pacing Threshold Pulse Width: 0.5 ms
Lead Channel Pacing Threshold Pulse Width: 0.5 ms
Lead Channel Sensing Intrinsic Amplitude: 11.7 mV
Lead Channel Setting Pacing Amplitude: 2.5 V
Lead Channel Setting Pacing Pulse Width: 0.5 ms
Lead Channel Setting Sensing Sensitivity: 0.5 mV
Pulse Gen Serial Number: 1017632

## 2022-03-07 LAB — CBC
Hematocrit: 46.1 % (ref 37.5–51.0)
Hemoglobin: 15.7 g/dL (ref 13.0–17.7)
MCH: 31.1 pg (ref 26.6–33.0)
MCHC: 34.1 g/dL (ref 31.5–35.7)
MCV: 91 fL (ref 79–97)
Platelets: 264 10*3/uL (ref 150–450)
RBC: 5.05 x10E6/uL (ref 4.14–5.80)
RDW: 13 % (ref 11.6–15.4)
WBC: 4.5 10*3/uL (ref 3.4–10.8)

## 2022-03-07 LAB — BASIC METABOLIC PANEL
BUN/Creatinine Ratio: 9 (ref 9–20)
BUN: 13 mg/dL (ref 6–24)
CO2: 31 mmol/L — ABNORMAL HIGH (ref 20–29)
Calcium: 11.4 mg/dL — ABNORMAL HIGH (ref 8.7–10.2)
Chloride: 103 mmol/L (ref 96–106)
Creatinine, Ser: 1.44 mg/dL — ABNORMAL HIGH (ref 0.76–1.27)
Glucose: 82 mg/dL (ref 70–99)
Potassium: 5 mmol/L (ref 3.5–5.2)
Sodium: 138 mmol/L (ref 134–144)
eGFR: 61 mL/min/{1.73_m2} (ref >59)

## 2022-03-07 NOTE — Patient Instructions (Addendum)
Medication Instructions:   Your physician recommends that you continue on your current medications as directed. Please refer to the Current Medication list given to you today.   *If you need a refill on your cardiac medications before your next appointment, please call your pharmacy*   Lab Work: BMET AND CBC TODAY    If you have labs (blood work) drawn today and your tests are completely normal, you will receive your results only by: MyChart Message (if you have MyChart) OR A paper copy in the mail If you have any lab test that is abnormal or we need to change your treatment, we will call you to review the results.   Testing/Procedures: SEE LETTER FOR GENERATOR CHANGE ON 03-11-22     Follow-Up: At [facility], you and your health needs are our priority.  As part of our continuing mission to provide you with exceptional heart care, we have created designated Provider Care Teams.  These Care Teams include your primary Cardiologist (physician) and Advanced Practice Providers (APPs -  Physician Assistants and Nurse Practitioners) who all work together to provide you with the care you need, when you need it.  We recommend signing up for the patient portal called "MyChart".  Sign up information is provided on this After Visit Summary.  MyChart is used to connect with patients for Virtual Visits (Telemedicine).  Patients are able to view lab/test results, encounter notes, upcoming appointments, etc.  Non-urgent messages can be sent to your provider as well.   To learn more about what you can do with MyChart, go to [email or web address].    Your next appointment:  10-14 DAYS AFTER 03-11-22 FOR WOUND CHECK                  AND       3 MONTHS PHYS DEFIB CHK  The format for your next appointment:   In Person  Provider:   Sherryl Manges, MD    Other Instructions  Cardioverter Defibrillator Implantation An implantable cardioverter defibrillator (ICD) is a device that identifies  and corrects abnormal heart rhythms. Cardioverter defibrillator implantation is a surgery to place an ICD under the skin in the chest or abdomen. An ICD has a battery, a small computer (pulse generator), and wires (leads) that go into the heart. The ICD detects and corrects two types of dangerous irregular heart rhythms (arrhythmias): A rapid heart rhythm in the lower chambers of the heart (ventricles). This is called ventricular tachycardia. The ventricles contracting in an uncoordinated way. This is called ventricular fibrillation. There are different types of ICDs, and the electrical signals from the ICD can be programmed differently based on the condition being treated. The electrical signals from the ICD can be low-energy pulses, high-energy shocks, or a combination of the two. The low-energy pulses are generally used to restore the heartbeat to normal when it is either too slow (bradycardia) or too fast. These pulses are painless. The high-energy shocks are used to treat abnormal rhythms such as ventricular tachycardia or ventricular fibrillation. This shock may feel like a strong jolt in the chest. Your health care provider may recommend an ICD if you have: Had a ventricular arrhythmia in the past. A damaged heart because of a disease or heart condition. A weakened heart muscle from a heart attack or cardiac arrest. A congenital heart defect. Long QT syndrome, which is a disorder of the heart's electrical system. Brugada syndrome, which is a condition that causes a disruption of the heart's normal  rhythm. Tell a health care provider about: Any allergies you have. All medicines you are taking, including vitamins, herbs, eye drops, creams, and over-the-counter medicines. Any problems you or family members have had with anesthetic medicines. Any blood disorders you have. Any surgeries you have had. Any medical conditions you have. Whether you are pregnant or may be pregnant. What are the  risks? Generally, this is a safe procedure. However, problems may occur, including: Infection. Bleeding. Allergic reactions to medicines used during the procedure. Blood clots. Swelling or bruising. Damage to nearby structures or organs, such as nerves, lungs, blood vessels, or the heart where the ICD leads or pulse generator is implanted. What happens before the procedure? Staying hydrated Follow instructions from your health care provider about hydration, which may include: Up to 2 hours before the procedure - you may continue to drink clear liquids, such as water, clear fruit juice, black coffee, and plain tea.  Eating and drinking restrictions Follow instructions from your health care provider about eating and drinking, which may include: 8 hours before the procedure - stop eating heavy meals or foods, such as meat, fried foods, or fatty foods. 6 hours before the procedure - stop eating light meals or foods, such as toast or cereal. 6 hours before the procedure - stop drinking milk or drinks that contain milk. 2 hours before the procedure - stop drinking clear liquids. Medicines Ask your health care provider about: Changing or stopping your regular medicines. This is especially important if you are taking diabetes medicines or blood thinners. Taking medicines such as aspirin and ibuprofen. These medicines can thin your blood. Do not take these medicines unless your health care provider tells you to take them. Taking over-the-counter medicines, vitamins, herbs, and supplements. Tests You may have an exam or testing. These may include: Blood tests. A test to check the electrical signals in your heart (electrocardiogram, ECG). Imaging tests, such as a chest X-ray. Echocardiogram. This is an ultrasound of your heart to evaluate your heart structures and function. An event monitor or Holter monitor to wear at home. General instructions Do not use any products that contain nicotine or  tobacco for at least 4 weeks before the procedure. These products include cigarettes, chewing tobacco, and vaping devices, such as e-cigarettes. If you need help quitting, ask your health care provider. Ask your health care provider: How your procedure site will be marked. What steps will be taken to help prevent infection. These may include: Removing hair at the surgery site. Washing skin with a germ-killing soap. Taking antibiotic medicine. You may be asked to shower with a germ-killing soap. Plan to have a responsible adult take you home from the hospital or clinic. What happens during the procedure?  Small monitors will be put on your body. They will be used to check your heart rate, blood pressure, and oxygen level. A pair of sticky pads (defibrillator pads) may be placed on your back and chest. These pads are able to pace your heart as needed during the procedure. An IV will be inserted into one of your veins. You will be given one or more of the following: A medicine to help you relax (sedative). A medicine to numb the area (local anesthetic). A medicine to make you fall asleep(general anesthetic). A small incision will be made to create a deep pocket under the skin of your chest or abdomen. Leads will be guided through a blood vessel into your heart and attached to your heart muscles. Depending on  the ICD, the leads may go into one ventricle, or they may go into both ventricles and into an upper chamber of the heart. An X-ray machine (fluoroscope) will be used to help guide the leads. The other end of the leads will be attached to the pulse generator. The pulse generator will be placed into the pocket under the skin. The ICD will be tested, and your health care provider will program the ICD for the condition being treated. The incision will be closed with stitches (sutures), skin glue, adhesive strips, or staples. A bandage (dressing) will be placed over the incision. The procedure  may vary among health care providers and hospitals. What happens after the procedure? Your blood pressure, heart rate, breathing rate, and blood oxygen level will be monitored until you leave the hospital or clinic. Your health care provider will also monitor your ICD to make sure it is working properly. A chest X-ray will be taken to check that the ICD is in the right place. Do not raise the arm on the side of your procedure higher than your shoulder for as long as told by your health care provider. This is usually at least 6 weeks. You may be given an identification card explaining that you have an ICD. You will be given a remote home monitoring device to use with your ICD to allow your device to communicate with your clinic. Summary An implantable cardioverter defibrillator (ICD) is a device that identifies and corrects abnormal heart rhythms. Cardioverter defibrillator implantation is a surgery to place an ICD under the skin in the chest or abdomen. An ICD consists of a battery, a small computer (pulse generator), and wires (leads) that go into the heart. During the procedure, the ICD will be tested, and your health care provider will program the ICD for the condition being treated. After the procedure, a chest X-ray will be taken to check that the ICD is in the right place. This information is not intended to replace advice given to you by your health care provider. Make sure you discuss any questions you have with your health care provider. Document Revised: 11/20/2019 Document Reviewed: 11/20/2019 Elsevier Patient Education  2023 Elsevier Inc.  Important Information About Sugar

## 2022-03-10 NOTE — Progress Notes (Signed)
Remote ICD transmission.   

## 2022-03-11 ENCOUNTER — Ambulatory Visit (HOSPITAL_COMMUNITY): Admit: 2022-03-11 | Payer: 59 | Admitting: Internal Medicine

## 2022-03-11 ENCOUNTER — Encounter (HOSPITAL_COMMUNITY): Payer: Self-pay

## 2022-03-11 SURGERY — ICD GENERATOR CHANGEOUT

## 2022-03-15 ENCOUNTER — Encounter: Payer: 59 | Admitting: Genetic Counselor

## 2022-03-15 ENCOUNTER — Ambulatory Visit: Payer: 59 | Admitting: Student

## 2022-03-28 ENCOUNTER — Ambulatory Visit (HOSPITAL_COMMUNITY)
Admission: RE | Admit: 2022-03-28 | Discharge: 2022-03-28 | Disposition: A | Discharge status: Home / Self Care | Payer: 59 | Attending: Internal Medicine | Admitting: Internal Medicine

## 2022-03-28 ENCOUNTER — Encounter (HOSPITAL_COMMUNITY)
Admission: RE | Disposition: A | Discharge status: Home / Self Care | Payer: 59 | Source: Home / Self Care | Attending: Internal Medicine

## 2022-03-28 ENCOUNTER — Other Ambulatory Visit: Payer: Self-pay

## 2022-03-28 DIAGNOSIS — Z4502 Encounter for adjustment and management of automatic implantable cardiac defibrillator: Secondary | ICD-10-CM | POA: Diagnosis present

## 2022-03-28 DIAGNOSIS — Z8674 Personal history of sudden cardiac arrest: Secondary | ICD-10-CM | POA: Insufficient documentation

## 2022-03-28 HISTORY — PX: ICD GENERATOR CHANGEOUT: EP1231

## 2022-03-28 SURGERY — ICD GENERATOR CHANGEOUT

## 2022-03-28 MED ORDER — CHLORHEXIDINE GLUCONATE 4 % EX LIQD
4.0000 | Freq: Once | CUTANEOUS | Status: DC
  Filled 2022-03-28: qty 60

## 2022-03-28 MED ORDER — FENTANYL CITRATE (PF) 100 MCG/2ML IJ SOLN
INTRAMUSCULAR | Status: AC
  Filled 2022-03-28: qty 2

## 2022-03-28 MED ORDER — FENTANYL CITRATE (PF) 100 MCG/2ML IJ SOLN
INTRAMUSCULAR | Status: DC | PRN
  Administered 2022-03-28: 50 ug via INTRAVENOUS
  Administered 2022-03-28 (×2): 25 ug via INTRAVENOUS

## 2022-03-28 MED ORDER — LIDOCAINE HCL (PF) 1 % IJ SOLN
INTRAMUSCULAR | Status: DC | PRN
  Administered 2022-03-28: 50 mL

## 2022-03-28 MED ORDER — MIDAZOLAM HCL 5 MG/5ML IJ SOLN
INTRAMUSCULAR | Status: DC | PRN
  Administered 2022-03-28: 1 mg via INTRAVENOUS
  Administered 2022-03-28: 2 mg via INTRAVENOUS
  Administered 2022-03-28: 1 mg via INTRAVENOUS

## 2022-03-28 MED ORDER — SODIUM CHLORIDE 0.9 % IV SOLN: INTRAVENOUS | Status: DC

## 2022-03-28 MED ORDER — MIDAZOLAM HCL 5 MG/5ML IJ SOLN
INTRAMUSCULAR | Status: AC
  Filled 2022-03-28: qty 5

## 2022-03-28 MED ORDER — CEFAZOLIN SODIUM-DEXTROSE 2-4 GM/100ML-% IV SOLN
INTRAVENOUS | Status: AC
  Filled 2022-03-28: qty 100

## 2022-03-28 MED ORDER — ACETAMINOPHEN 325 MG PO TABS: 325.0000 mg | ORAL_TABLET | ORAL | Status: DC | PRN

## 2022-03-28 MED ORDER — POVIDONE-IODINE 10 % EX SWAB
2.0000 | Freq: Once | CUTANEOUS | Status: AC
  Administered 2022-03-28: 2 via TOPICAL

## 2022-03-28 MED ORDER — SODIUM CHLORIDE 0.9 % IV SOLN
INTRAVENOUS | Status: AC
  Filled 2022-03-28: qty 2

## 2022-03-28 MED ORDER — SODIUM CHLORIDE 0.9 % IV SOLN
80.0000 mg | INTRAVENOUS | Status: AC
  Administered 2022-03-28: 80 mg

## 2022-03-28 MED ORDER — LIDOCAINE HCL (PF) 1 % IJ SOLN
INTRAMUSCULAR | Status: AC
  Filled 2022-03-28: qty 30

## 2022-03-28 MED ORDER — CEFAZOLIN SODIUM-DEXTROSE 2-4 GM/100ML-% IV SOLN
2.0000 g | INTRAVENOUS | Status: AC
  Administered 2022-03-28: 2 g via INTRAVENOUS

## 2022-03-28 SURGICAL SUPPLY — 5 items
CABLE SURGICAL S-101-97-12 (CABLE) ×1 IMPLANT
HEMOSTAT SURGICEL 2X4 FIBR (HEMOSTASIS) IMPLANT
ICD FORTIFY ASSU VR CD1357-40C (ICD Generator) IMPLANT
PAD DEFIB RADIO PHYSIO CONN (PAD) ×1 IMPLANT
TRAY PACEMAKER INSERTION (PACKS) ×1 IMPLANT

## 2022-03-28 NOTE — Interval H&P Note (Signed)
History and Physical Interval Note:  03/28/2022 12:05 PM  [name]  has presented today for surgery, with the diagnosis of eri.  The various methods of treatment have been discussed with the patient and family. After consideration of risks, benefits and other options for treatment, the patient has consented to  Procedure(s): ICD [place] ([name]) as a surgical intervention.  The patient's history has been reviewed, patient examined, no change in status, stable for surgery.  I have reviewed the patient's chart and labs.  Questions were answered to the patient's satisfaction.     Virl Axe

## 2022-03-28 NOTE — Interval H&P Note (Signed)
History and Physical Interval Note:  03/28/2022 12:06 PM  [name]  has presented today for surgery, with the diagnosis of eri.  The various methods of treatment have been discussed with the patient and family. After consideration of risks, benefits and other options for treatment, the patient has consented to  Procedure(s): ICD [place] ([name]) as a surgical intervention.  The patient's history has been reviewed, patient examined, no change in status, stable for surgery.  I have reviewed the patient's chart and labs.  Questions were answered to the patient's satisfaction.     Virl Axe

## 2022-03-28 NOTE — Discharge Instructions (Signed)

## 2022-03-29 ENCOUNTER — Encounter (HOSPITAL_COMMUNITY): Payer: Self-pay | Admitting: Internal Medicine

## 2022-04-07 ENCOUNTER — Ambulatory Visit: Payer: 59

## 2022-04-08 ENCOUNTER — Ambulatory Visit: Payer: 59 | Attending: Cardiovascular Disease

## 2022-04-08 DIAGNOSIS — I469 Cardiac arrest, cause unspecified: Secondary | ICD-10-CM | POA: Diagnosis not present

## 2022-04-08 LAB — CUP PACEART INCLINIC DEVICE CHECK
Battery Remaining Longevity: 105 mo
Brady Statistic RV Percent Paced: 0 %
Date Time Interrogation Session: 20231103161053
HighPow Impedance: 66.375
HighPow Impedance: 66.375
Implantable Lead Connection Status: 753985
Implantable Lead Implant Date: 20120813
Implantable Lead Location: 753860
Implantable Lead Model: 181
Implantable Lead Serial Number: 308767
Implantable Pulse Generator Implant Date: 20120813
Lead Channel Impedance Value: 437.5 Ohm
Lead Channel Impedance Value: 437.5 Ohm
Lead Channel Pacing Threshold Amplitude: 1 V
Lead Channel Pacing Threshold Amplitude: 1 V
Lead Channel Pacing Threshold Amplitude: 1 V
Lead Channel Pacing Threshold Amplitude: 1 V
Lead Channel Pacing Threshold Pulse Width: 0.5 ms
Lead Channel Pacing Threshold Pulse Width: 0.5 ms
Lead Channel Pacing Threshold Pulse Width: 0.5 ms
Lead Channel Pacing Threshold Pulse Width: 0.5 ms
Lead Channel Sensing Intrinsic Amplitude: 11.6 mV
Lead Channel Sensing Intrinsic Amplitude: 11.6 mV
Lead Channel Setting Pacing Amplitude: 2.5 V
Lead Channel Setting Pacing Pulse Width: 0.5 ms
Lead Channel Setting Sensing Sensitivity: 0.5 mV
Pulse Gen Serial Number: 5550994

## 2022-04-08 NOTE — Progress Notes (Signed)
Wound check appointment. Dermabond removed. Wound without redness or edema. Incision edges approximated, wound well healed. Normal device function. Thresholds, sensing, and impedances consistent with implant measurements. Device programmed at 3.5V for extra safety margin until 3 month visit. Histogram distribution appropriate for patient and level of activity. No ventricular arrhythmias noted. Patient educated about wound care, arm mobility, shock plan. ROV in 3 months with implanting physician.

## 2022-04-08 NOTE — Patient Instructions (Signed)
   After Your ICD (Implantable Cardiac Defibrillator)    Monitor your defibrillator site for redness, swelling, and drainage. Call the device clinic at [phone] if you experience these symptoms or fever/chills.  Your incision was closed with Dermabond:  You may shower 1 day after your defibrillator implant and wash your incision with soap and water. Avoid lotions, ointments, or perfumes over your incision until it is well-healed.  You may use a hot tub or a pool after your wound check appointment if the incision is completely closed.  Your ICD is designed to protect you from life threatening heart rhythms. Because of this, you may receive a shock.   1 shock with no symptoms:  Call the office during business hours. 1 shock with symptoms (chest pain, chest pressure, dizziness, lightheadedness, shortness of breath, overall feeling unwell):  Call 911. If you experience 2 or more shocks in 24 hours:  Call 911. If you receive a shock, you should not drive.  [place] DMV - no driving for 6 months if you receive appropriate therapy from your ICD.   ICD Alerts:  Some alerts are vibratory and others beep. These are NOT emergencies. Please call our office to let us know. If this occurs at night or on weekends, it can wait until the next business day. Send a remote transmission.  If your device is capable of reading fluid status (for heart failure), you will be offered monthly monitoring to review this with you.   Remote monitoring is used to monitor your ICD from home. This monitoring is scheduled every 91 days by our office. It allows us to keep an eye on the functioning of your device to ensure it is working properly. You will routinely see your Electrophysiologist annually (more often if necessary).  

## 2022-05-13 ENCOUNTER — Encounter: Payer: 59 | Admitting: Internal Medicine

## 2022-06-28 ENCOUNTER — Ambulatory Visit: Payer: Self-pay | Attending: Internal Medicine

## 2022-06-28 DIAGNOSIS — I469 Cardiac arrest, cause unspecified: Secondary | ICD-10-CM

## 2022-06-28 LAB — CUP PACEART REMOTE DEVICE CHECK
Battery Remaining Longevity: 103 mo
Battery Remaining Percentage: 94 %
Battery Voltage: 3.2 V
Brady Statistic RV Percent Paced: 1 %
Date Time Interrogation Session: 20240123063713
HighPow Impedance: 74 Ohm
HighPow Impedance: 74 Ohm
Implantable Lead Connection Status: 753985
Implantable Lead Implant Date: 20120813
Implantable Lead Location: 753860
Implantable Lead Model: 181
Implantable Lead Serial Number: 308767
Implantable Pulse Generator Implant Date: 20231023
Lead Channel Impedance Value: 450 Ohm
Lead Channel Pacing Threshold Amplitude: 1 V
Lead Channel Pacing Threshold Pulse Width: 0.5 ms
Lead Channel Sensing Intrinsic Amplitude: 11.6 mV
Lead Channel Setting Pacing Amplitude: 2.5 V
Lead Channel Setting Pacing Pulse Width: 0.5 ms
Lead Channel Setting Sensing Sensitivity: 0.5 mV
Pulse Gen Serial Number: 5550994

## 2022-06-30 ENCOUNTER — Ambulatory Visit: Payer: Self-pay | Admitting: Internal Medicine

## 2022-07-06 ENCOUNTER — Encounter: Payer: Self-pay | Admitting: Internal Medicine

## 2022-07-06 DIAGNOSIS — Z95810 Presence of automatic (implantable) cardiac defibrillator: Secondary | ICD-10-CM

## 2022-07-06 DIAGNOSIS — I469 Cardiac arrest, cause unspecified: Secondary | ICD-10-CM

## 2022-07-06 DIAGNOSIS — Q999 Chromosomal abnormality, unspecified: Secondary | ICD-10-CM

## 2022-07-25 NOTE — Progress Notes (Signed)
Remote ICD transmission.   

## 2022-09-27 ENCOUNTER — Ambulatory Visit (INDEPENDENT_AMBULATORY_CARE_PROVIDER_SITE_OTHER): Payer: 59

## 2022-09-27 DIAGNOSIS — I469 Cardiac arrest, cause unspecified: Secondary | ICD-10-CM

## 2022-09-28 LAB — CUP PACEART REMOTE DEVICE CHECK
Battery Remaining Longevity: 101 mo
Battery Remaining Percentage: 92 %
Battery Voltage: 3.17 V
Brady Statistic RV Percent Paced: 1 %
Date Time Interrogation Session: 20240423020017
HighPow Impedance: 71 Ohm
HighPow Impedance: 71 Ohm
Implantable Lead Connection Status: 753985
Implantable Lead Implant Date: 20120813
Implantable Lead Location: 753860
Implantable Lead Model: 181
Implantable Lead Serial Number: 308767
Implantable Pulse Generator Implant Date: 20231023
Lead Channel Impedance Value: 440 Ohm
Lead Channel Pacing Threshold Amplitude: 1 V
Lead Channel Pacing Threshold Pulse Width: 0.5 ms
Lead Channel Sensing Intrinsic Amplitude: 11.6 mV
Lead Channel Setting Pacing Amplitude: 2.5 V
Lead Channel Setting Pacing Pulse Width: 0.5 ms
Lead Channel Setting Sensing Sensitivity: 0.5 mV
Pulse Gen Serial Number: 5550994

## 2022-10-25 NOTE — Progress Notes (Signed)
Remote ICD transmission.   

## 2022-12-27 ENCOUNTER — Ambulatory Visit (INDEPENDENT_AMBULATORY_CARE_PROVIDER_SITE_OTHER): Payer: 59

## 2022-12-27 DIAGNOSIS — I469 Cardiac arrest, cause unspecified: Secondary | ICD-10-CM

## 2022-12-28 LAB — CUP PACEART REMOTE DEVICE CHECK
Battery Remaining Longevity: 98 mo
Battery Remaining Percentage: 90 %
Battery Voltage: 3.13 V
Brady Statistic RV Percent Paced: 1 %
Date Time Interrogation Session: 20240723020026
HighPow Impedance: 71 Ohm
HighPow Impedance: 71 Ohm
Implantable Lead Connection Status: 753985
Implantable Lead Implant Date: 20120813
Implantable Lead Location: 753860
Implantable Lead Model: 181
Implantable Lead Serial Number: 308767
Implantable Pulse Generator Implant Date: 20231023
Lead Channel Impedance Value: 440 Ohm
Lead Channel Pacing Threshold Amplitude: 1 V
Lead Channel Pacing Threshold Pulse Width: 0.5 ms
Lead Channel Sensing Intrinsic Amplitude: 11.6 mV
Lead Channel Setting Pacing Amplitude: 2.5 V
Lead Channel Setting Pacing Pulse Width: 0.5 ms
Lead Channel Setting Sensing Sensitivity: 0.5 mV
Pulse Gen Serial Number: 5550994

## 2023-01-11 NOTE — Progress Notes (Signed)
Remote ICD transmission.   

## 2023-03-28 ENCOUNTER — Ambulatory Visit (INDEPENDENT_AMBULATORY_CARE_PROVIDER_SITE_OTHER): Payer: 59

## 2023-03-28 DIAGNOSIS — I469 Cardiac arrest, cause unspecified: Secondary | ICD-10-CM

## 2023-03-29 LAB — CUP PACEART REMOTE DEVICE CHECK
Battery Remaining Longevity: 97 mo
Battery Remaining Percentage: 89 %
Battery Voltage: 3.11 V
Brady Statistic RV Percent Paced: 1 %
Date Time Interrogation Session: 20241022020023
HighPow Impedance: 71 Ohm
HighPow Impedance: 71 Ohm
Implantable Lead Connection Status: 753985
Implantable Lead Implant Date: 20120813
Implantable Lead Location: 753860
Implantable Lead Model: 181
Implantable Lead Serial Number: 308767
Implantable Pulse Generator Implant Date: 20231023
Lead Channel Impedance Value: 440 Ohm
Lead Channel Pacing Threshold Amplitude: 1 V
Lead Channel Pacing Threshold Pulse Width: 0.5 ms
Lead Channel Sensing Intrinsic Amplitude: 11.6 mV
Lead Channel Setting Pacing Amplitude: 2.5 V
Lead Channel Setting Pacing Pulse Width: 0.5 ms
Lead Channel Setting Sensing Sensitivity: 0.5 mV
Pulse Gen Serial Number: 5550994

## 2023-04-14 NOTE — Progress Notes (Signed)
Remote ICD transmission.   

## 2023-06-27 ENCOUNTER — Ambulatory Visit (INDEPENDENT_AMBULATORY_CARE_PROVIDER_SITE_OTHER): Payer: 59

## 2023-06-27 DIAGNOSIS — I469 Cardiac arrest, cause unspecified: Secondary | ICD-10-CM

## 2023-06-27 LAB — CUP PACEART REMOTE DEVICE CHECK
Battery Remaining Longevity: 95 mo
Battery Remaining Percentage: 87 %
Battery Voltage: 3.08 V
Brady Statistic RV Percent Paced: 1 %
Date Time Interrogation Session: 20250121025358
HighPow Impedance: 72 Ohm
HighPow Impedance: 72 Ohm
Implantable Lead Connection Status: 753985
Implantable Lead Implant Date: 20120813
Implantable Lead Location: 753860
Implantable Lead Model: 181
Implantable Lead Serial Number: 308767
Implantable Pulse Generator Implant Date: 20231023
Lead Channel Impedance Value: 440 Ohm
Lead Channel Pacing Threshold Amplitude: 1 V
Lead Channel Pacing Threshold Pulse Width: 0.5 ms
Lead Channel Sensing Intrinsic Amplitude: 11.6 mV
Lead Channel Setting Pacing Amplitude: 2.5 V
Lead Channel Setting Pacing Pulse Width: 0.5 ms
Lead Channel Setting Sensing Sensitivity: 0.5 mV
Pulse Gen Serial Number: 5550994

## 2023-07-17 ENCOUNTER — Other Ambulatory Visit: Payer: Self-pay | Admitting: Internal Medicine

## 2023-07-18 NOTE — Telephone Encounter (Signed)
Pt last appt was in 2023. Please advise

## 2023-08-02 ENCOUNTER — Encounter: Payer: Self-pay | Admitting: Internal Medicine

## 2023-08-07 NOTE — Progress Notes (Signed)
 Remote ICD transmission.

## 2023-08-13 ENCOUNTER — Other Ambulatory Visit: Payer: Self-pay

## 2023-08-13 ENCOUNTER — Encounter (HOSPITAL_BASED_OUTPATIENT_CLINIC_OR_DEPARTMENT_OTHER): Payer: Self-pay | Admitting: Urology

## 2023-08-13 ENCOUNTER — Emergency Department (HOSPITAL_BASED_OUTPATIENT_CLINIC_OR_DEPARTMENT_OTHER)
Admission: EM | Admit: 2023-08-13 | Discharge: 2023-08-14 | Disposition: A | Discharge status: Home / Self Care | Payer: Self-pay | Attending: Emergency Medicine | Admitting: Emergency Medicine

## 2023-08-13 DIAGNOSIS — I10 Essential (primary) hypertension: Secondary | ICD-10-CM | POA: Diagnosis present

## 2023-08-13 NOTE — ED Triage Notes (Signed)
 Pt states HTN today since 1500  194/130 at home and down to 137/102  Using wrist monitor , unknown when last calibrated   Denies any pain or SOB at this time  Took BP meds as prescribed    [employer]. Jude Pacemaker, replaced 10/23

## 2023-08-14 LAB — BASIC METABOLIC PANEL
Anion gap: 8 (ref 5–15)
BUN: 8 mg/dL (ref 6–20)
CO2: 25 mmol/L (ref 22–32)
Calcium: 9.7 mg/dL (ref 8.9–10.3)
Chloride: 104 mmol/L (ref 98–111)
Creatinine, Ser: 1.33 mg/dL — ABNORMAL HIGH (ref 0.61–1.24)
GFR, Estimated: >60 mL/min (ref >60)
Glucose, Bld: 111 mg/dL — ABNORMAL HIGH (ref 70–99)
Potassium: 3.8 mmol/L (ref 3.5–5.1)
Sodium: 137 mmol/L (ref 135–145)

## 2023-08-14 NOTE — Discharge Instructions (Signed)
 Please follow with your PCP and/or Cardiologist in the next week.

## 2023-08-14 NOTE — ED Provider Notes (Signed)
 Emergency Department Provider Note   I have reviewed the triage vital signs and the nursing notes.   HISTORY  Chief Complaint Hypertension   HPI [name] is a 48 y.o. male with past history of ventricular fibrillation and cardiac arrest status post [employer] Jude ICD presents to the emergency department with elevated blood pressures at home.  Patient denies any specific symptoms.  He states over the past couple of days he was out of town and eating relatively poorly.  He states he took in more salt than he normally does.  He checked his blood pressure this evening and found it to be in the systolic 190 range.  He is typically in the 130s.  He takes his propranolol and is compliant with this medication.  Denies any chest pain, shortness of breath, other associated symptoms.   Past Medical History:  Diagnosis Date   Cardiac arrest - ventricular fibrillation    a. s/p STJ ICD   Sinus tachycardia     Review of Systems  Constitutional: No fever/chills Cardiovascular: Denies chest pain. Positive elevated BP at home.  Respiratory: Denies shortness of breath. Gastrointestinal: No abdominal pain.  No nausea, no vomiting. Neurological: Negative for headaches, focal weakness or numbness.  ____________________________________________   PHYSICAL EXAM:  VITAL SIGNS: ED Triage Vitals  Encounter Vitals Group     BP 08/13/23 2350 (!) 152/98     Pulse Rate 08/13/23 2350 85     Resp 08/13/23 2350 18     Temp 08/13/23 2350 98.4 F (36.9 C)     Temp Source 08/13/23 2350 Oral     SpO2 08/13/23 2350 98 %     Weight 08/13/23 2349 205 lb 14.6 oz (93.4 kg)     Height 08/13/23 2349 5\' 11"  (1.803 m)   Constitutional: Alert and oriented. Well appearing and in no acute distress. Eyes: Conjunctivae are normal.  Head: Atraumatic. Nose: No congestion/rhinnorhea. Mouth/Throat: Mucous membranes are moist. Neck: No stridor.  Cardiovascular: Normal rate, regular rhythm. Good peripheral  circulation. Grossly normal heart sounds.   Respiratory: Normal respiratory effort.  No retractions. Lungs CTAB. Gastrointestinal: No distention.  Musculoskeletal: No gross deformities of extremities. Neurologic:  Normal speech and language.  Skin:  Skin is warm, dry and intact. No rash noted.  ____________________________________________   LABS (all labs ordered are listed, but only abnormal results are displayed)  Labs Reviewed  BASIC METABOLIC PANEL - Abnormal; Notable for the following components:      Result Value   Glucose, Bld 111 (*)    Creatinine, Ser 1.33 (*)    All other components within normal limits   ____________________________________________   PROCEDURES  Procedure(s) performed:   Procedures  None  ____________________________________________   INITIAL IMPRESSION / ASSESSMENT AND PLAN / ED COURSE  Pertinent labs & imaging results that were available during my care of the patient were reviewed by me and considered in my medical decision making (see chart for details).   This patient is Presenting for Evaluation of HTN, which does require a range of treatment options, and is a complaint that involves a high risk of morbidity and mortality.  The Differential Diagnoses includes but is not exclusive to acute coronary syndrome, aortic dissection, pulmonary embolism, cardiac tamponade, community-acquired pneumonia, pericarditis, musculoskeletal chest wall pain, etc.   I decided to review pertinent External Data, and in summary last ICD interrogation in Feb 2025 was WNL.   Clinical Laboratory Tests Ordered, included BMP without AKI. Normal electrolytes.  Medical Decision Making: Summary:  Patient presents emergency department for evaluation of elevated blood pressure at home.  On my evaluation his systolic blood pressures in the 130 range.  No symptoms.  Plan for screening blood work given his cardiac history, primarily looking for electrolyte abnormalities.   We discussed decreasing his salt intake and following with his cardiology team for further blood pressure evaluation and consideration of starting to hypertensives as needed.  I do not see any evidence of acute hypertensive emergency.   Patient's presentation is most consistent with acute, uncomplicated illness.   Disposition: discharge  ____________________________________________  FINAL CLINICAL IMPRESSION(S) / ED DIAGNOSES  Final diagnoses:  Hypertension, unspecified type    Note:  This document was prepared using Dragon voice recognition software and may include unintentional dictation errors.  Alona Bene, MD, [facility]    Dreana Britz, Arlyss Repress, MD 08/14/23 [phone]
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
## 2023-08-15 ENCOUNTER — Other Ambulatory Visit: Payer: Self-pay | Admitting: Internal Medicine
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
## 2023-08-29 ENCOUNTER — Other Ambulatory Visit: Payer: Self-pay | Admitting: Internal Medicine

## 2023-09-26 ENCOUNTER — Ambulatory Visit (INDEPENDENT_AMBULATORY_CARE_PROVIDER_SITE_OTHER): Payer: 59

## 2023-09-26 DIAGNOSIS — I469 Cardiac arrest, cause unspecified: Secondary | ICD-10-CM

## 2023-09-27 LAB — CUP PACEART REMOTE DEVICE CHECK
Battery Remaining Longevity: 92 mo
Battery Remaining Percentage: 85 %
Battery Voltage: 3.05 V
Brady Statistic RV Percent Paced: 1 %
Date Time Interrogation Session: 20250422022842
HighPow Impedance: 70 Ohm
HighPow Impedance: 70 Ohm
Implantable Lead Connection Status: 753985
Implantable Lead Implant Date: 20120813
Implantable Lead Location: 753860
Implantable Lead Model: 181
Implantable Lead Serial Number: 308767
Implantable Pulse Generator Implant Date: 20231023
Lead Channel Impedance Value: 440 Ohm
Lead Channel Pacing Threshold Amplitude: 1 V
Lead Channel Pacing Threshold Pulse Width: 0.5 ms
Lead Channel Sensing Intrinsic Amplitude: 11.6 mV
Lead Channel Setting Pacing Amplitude: 2.5 V
Lead Channel Setting Pacing Pulse Width: 0.5 ms
Lead Channel Setting Sensing Sensitivity: 0.5 mV
Pulse Gen Serial Number: 5550994

## 2023-10-08 ENCOUNTER — Encounter: Payer: Self-pay | Admitting: Cardiology

## 2023-11-09 NOTE — Addendum Note (Signed)
 Addended by: Lott Rouleau A on: 11/09/2023 02:31 PM   Modules accepted: Orders

## 2023-11-09 NOTE — Progress Notes (Signed)
 Remote ICD transmission.

## 2023-12-26 ENCOUNTER — Ambulatory Visit (INDEPENDENT_AMBULATORY_CARE_PROVIDER_SITE_OTHER): Payer: 59

## 2023-12-26 DIAGNOSIS — I469 Cardiac arrest, cause unspecified: Secondary | ICD-10-CM | POA: Diagnosis not present

## 2023-12-27 LAB — CUP PACEART REMOTE DEVICE CHECK
Battery Remaining Longevity: 90 mo
Battery Remaining Percentage: 83 %
Battery Voltage: 3.04 V
Brady Statistic RV Percent Paced: 1 %
Date Time Interrogation Session: 20250722020017
HighPow Impedance: 73 Ohm
HighPow Impedance: 73 Ohm
Implantable Lead Connection Status: 753985
Implantable Lead Implant Date: 20120813
Implantable Lead Location: 753860
Implantable Lead Model: 181
Implantable Lead Serial Number: 308767
Implantable Pulse Generator Implant Date: 20231023
Lead Channel Impedance Value: 440 Ohm
Lead Channel Pacing Threshold Amplitude: 1 V
Lead Channel Pacing Threshold Pulse Width: 0.5 ms
Lead Channel Sensing Intrinsic Amplitude: 11.6 mV
Lead Channel Setting Pacing Amplitude: 2.5 V
Lead Channel Setting Pacing Pulse Width: 0.5 ms
Lead Channel Setting Sensing Sensitivity: 0.5 mV
Pulse Gen Serial Number: 5550994

## 2024-02-29 NOTE — Progress Notes (Unsigned)
  Electrophysiology Office Follow up Visit Note:    Date:  03/01/2024   ID:  [name], DOB 06/06/1976, MRN [id number]  PCP:  [name] Francisco, MD  [facility] Cardiologist:  None  [facility] Electrophysiologist:  [name], MD    Interval History:     [name] is a 48 y.o. male who presents for a follow up visit.   The patient was last seen by [facility] March 07, 2022.  He was previously followed by Dr. Fernande.  The patient has a single-chamber ICD that was implanted January 17, 2011 for secondary prevention after cardiac arrest.  He takes propranolol .  He is doing well today.  No problems with his defibrillator pocket.  No syncope or presyncope.  Good energy level.      Past medical, surgical, social and family history were reviewed.  ROS:   Please see the history of present illness.    All other systems reviewed and are negative.  EKGs/Labs/Other Studies Reviewed:    The following studies were reviewed today:  March 01, 2024 in-clinic device interrogation personally reviewed Changed shock outputs to max given Gore lead. Shock impedance 80 ohms today Other lead parameters stable        Physical Exam:    VS:  BP 118/76   Pulse 90   Ht 5' 11 (1.803 m)   Wt 212 lb (96.2 kg)   SpO2 98%   BMI 29.57 kg/m     Wt Readings from Last 3 Encounters:  03/01/24 212 lb (96.2 kg)  08/13/23 205 lb 14.6 oz (93.4 kg)  03/28/22 206 lb (93.4 kg)     GEN: no distress CARD: RRR, No MRG.  ICD pocket well-healed RESP: No IWOB. CTAB.      ASSESSMENT:    1. 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  Continue remote monitoring I did discuss the [employer] [place] lead advisory today and programmed max output of shocks. Continue propranolol   Follow-up 1 year with APP  Signed, OLE HOLTS, MD, [facility], [facility] 03/01/2024 1:52  PM    Electrophysiology [facility]

## 2024-03-01 ENCOUNTER — Encounter: Payer: Self-pay | Admitting: Cardiology

## 2024-03-01 ENCOUNTER — Ambulatory Visit: Payer: Self-pay | Attending: Cardiology | Admitting: Cardiology

## 2024-03-01 VITALS — BP 118/76 | HR 90 | Ht 71.0 in | Wt 212.0 lb

## 2024-03-01 DIAGNOSIS — I469 Cardiac arrest, cause unspecified: Secondary | ICD-10-CM

## 2024-03-01 MED ORDER — PROPRANOLOL HCL ER 60 MG PO CP24: 60.0000 mg | ORAL_CAPSULE | Freq: Every day | ORAL | 3 refills | Status: AC

## 2024-03-01 NOTE — Patient Instructions (Signed)

## 2024-03-08 NOTE — Progress Notes (Signed)
 Remote ICD Transmission

## 2024-03-26 ENCOUNTER — Ambulatory Visit: Payer: 59

## 2024-03-26 DIAGNOSIS — I469 Cardiac arrest, cause unspecified: Secondary | ICD-10-CM

## 2024-03-27 LAB — CUP PACEART REMOTE DEVICE CHECK
Battery Remaining Longevity: 90 mo
Battery Remaining Percentage: 81 %
Battery Voltage: 3.02 V
Brady Statistic RV Percent Paced: 0 %
Date Time Interrogation Session: 20251021020018
HighPow Impedance: 72 Ohm
HighPow Impedance: 72 Ohm
Implantable Lead Connection Status: 753985
Implantable Lead Implant Date: 20120813
Implantable Lead Location: 753860
Implantable Lead Model: 181
Implantable Lead Serial Number: 308767
Implantable Pulse Generator Implant Date: 20231023
Lead Channel Impedance Value: 440 Ohm
Lead Channel Pacing Threshold Amplitude: 1 V
Lead Channel Pacing Threshold Pulse Width: 0.5 ms
Lead Channel Sensing Intrinsic Amplitude: 11.6 mV
Lead Channel Setting Pacing Amplitude: 2.5 V
Lead Channel Setting Pacing Pulse Width: 0.5 ms
Lead Channel Setting Sensing Sensitivity: 0.5 mV
Pulse Gen Serial Number: 5550994

## 2024-03-29 NOTE — Progress Notes (Signed)
 Remote ICD Transmission

## 2024-04-01 ENCOUNTER — Ambulatory Visit: Payer: Self-pay | Admitting: Cardiology

## 2024-04-15 ENCOUNTER — Ambulatory Visit: Payer: Self-pay | Admitting: Cardiology

## 2024-06-25 ENCOUNTER — Ambulatory Visit: Payer: Self-pay

## 2024-06-25 DIAGNOSIS — I469 Cardiac arrest, cause unspecified: Secondary | ICD-10-CM | POA: Diagnosis not present

## 2024-06-26 LAB — CUP PACEART REMOTE DEVICE CHECK
Battery Remaining Longevity: 88 mo
Battery Remaining Percentage: 79 %
Battery Voltage: 3.02 V
Brady Statistic RV Percent Paced: 0 %
Date Time Interrogation Session: 20260120050434
HighPow Impedance: 70 Ohm
HighPow Impedance: 70 Ohm
Implantable Lead Connection Status: 753985
Implantable Lead Implant Date: 20120813
Implantable Lead Location: 753860
Implantable Lead Model: 181
Implantable Lead Serial Number: 308767
Implantable Pulse Generator Implant Date: 20231023
Lead Channel Impedance Value: 410 Ohm
Lead Channel Pacing Threshold Amplitude: 1 V
Lead Channel Pacing Threshold Pulse Width: 0.5 ms
Lead Channel Sensing Intrinsic Amplitude: 11.6 mV
Lead Channel Setting Pacing Amplitude: 2.5 V
Lead Channel Setting Pacing Pulse Width: 0.5 ms
Lead Channel Setting Sensing Sensitivity: 0.5 mV
Pulse Gen Serial Number: 5550994

## 2024-06-28 NOTE — Progress Notes (Signed)
 Remote ICD Transmission

## 2024-07-02 ENCOUNTER — Ambulatory Visit: Payer: Self-pay | Admitting: Cardiovascular Disease

## 2024-09-24 ENCOUNTER — Ambulatory Visit: Payer: Self-pay

## 2024-12-24 ENCOUNTER — Ambulatory Visit: Payer: Self-pay

## 2025-03-25 ENCOUNTER — Ambulatory Visit: Payer: Self-pay

## 2025-06-24 ENCOUNTER — Ambulatory Visit: Payer: Self-pay
